# Patient Record
Sex: Female | Born: 1990 | Race: Black or African American | Hispanic: No | Marital: Single | Smoking: Never smoker
Health system: Southern US, Community
[De-identification: ages and names within clinical notes are randomized; demographics above are authoritative.]

## PROBLEM LIST (undated history)

## (undated) DIAGNOSIS — S83209A Unspecified tear of unspecified meniscus, current injury, unspecified knee, initial encounter: Secondary | ICD-10-CM

## (undated) DIAGNOSIS — L309 Dermatitis, unspecified: Secondary | ICD-10-CM

## (undated) DIAGNOSIS — F419 Anxiety disorder, unspecified: Secondary | ICD-10-CM

---
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
## 2011-02-27 ENCOUNTER — Inpatient Hospital Stay (INDEPENDENT_AMBULATORY_CARE_PROVIDER_SITE_OTHER)
Admission: RE | Admit: 2011-02-27 | Discharge: 2011-02-27 | Disposition: A | Discharge status: Home / Self Care | Payer: Medicaid Other | Source: Ambulatory Visit | Attending: Emergency Medicine | Admitting: Emergency Medicine

## 2011-02-27 ENCOUNTER — Ambulatory Visit (INDEPENDENT_AMBULATORY_CARE_PROVIDER_SITE_OTHER): Payer: Medicaid Other

## 2011-02-27 DIAGNOSIS — M2390 Unspecified internal derangement of unspecified knee: Secondary | ICD-10-CM

## 2011-04-16 ENCOUNTER — Other Ambulatory Visit: Payer: Self-pay | Admitting: Otolaryngology

## 2011-04-30 ENCOUNTER — Encounter (HOSPITAL_BASED_OUTPATIENT_CLINIC_OR_DEPARTMENT_OTHER): Payer: Self-pay | Admitting: *Deleted

## 2011-04-30 NOTE — Progress Notes (Signed)
NPO AFTER MN WITH EXCEPTION WATER/ GATORADE UNTIL 0800. PT ARRIVES AT 1215. NEEDS HG AND URINE PREG.

## 2011-05-01 ENCOUNTER — Ambulatory Visit (HOSPITAL_BASED_OUTPATIENT_CLINIC_OR_DEPARTMENT_OTHER)
Admission: RE | Admit: 2011-05-01 | Discharge: 2011-05-01 | Disposition: A | Discharge status: Home / Self Care | Payer: Self-pay | Source: Ambulatory Visit | Attending: Specialist | Admitting: Specialist

## 2011-05-01 ENCOUNTER — Encounter (HOSPITAL_BASED_OUTPATIENT_CLINIC_OR_DEPARTMENT_OTHER)
Admission: RE | Disposition: A | Discharge status: Home / Self Care | Payer: Self-pay | Source: Ambulatory Visit | Attending: Specialist

## 2011-05-01 ENCOUNTER — Encounter (HOSPITAL_BASED_OUTPATIENT_CLINIC_OR_DEPARTMENT_OTHER): Payer: Self-pay | Admitting: Certified Registered"

## 2011-05-01 ENCOUNTER — Ambulatory Visit (HOSPITAL_BASED_OUTPATIENT_CLINIC_OR_DEPARTMENT_OTHER): Payer: Self-pay | Admitting: Certified Registered"

## 2011-05-01 DIAGNOSIS — X58XXXA Exposure to other specified factors, initial encounter: Secondary | ICD-10-CM | POA: Insufficient documentation

## 2011-05-01 DIAGNOSIS — L259 Unspecified contact dermatitis, unspecified cause: Secondary | ICD-10-CM | POA: Insufficient documentation

## 2011-05-01 DIAGNOSIS — S83289A Other tear of lateral meniscus, current injury, unspecified knee, initial encounter: Secondary | ICD-10-CM | POA: Insufficient documentation

## 2011-05-01 DIAGNOSIS — M2340 Loose body in knee, unspecified knee: Secondary | ICD-10-CM | POA: Insufficient documentation

## 2011-05-01 DIAGNOSIS — M2240 Chondromalacia patellae, unspecified knee: Secondary | ICD-10-CM | POA: Insufficient documentation

## 2011-05-01 HISTORY — DX: Dermatitis, unspecified: L30.9

## 2011-05-01 HISTORY — PX: KNEE ARTHROSCOPY: SHX127

## 2011-05-01 HISTORY — DX: Unspecified tear of unspecified meniscus, current injury, unspecified knee, initial encounter: S83.209A

## 2011-05-01 SURGERY — ARTHROSCOPY, KNEE
Anesthesia: General | Site: Knee | Laterality: Left

## 2011-05-01 MED ORDER — EPINEPHRINE HCL 1 MG/ML IJ SOLN
INTRAMUSCULAR | Status: DC | PRN
  Administered 2011-05-01: 2 mL

## 2011-05-01 MED ORDER — PROMETHAZINE HCL 25 MG/ML IJ SOLN: 6.2500 mg | INTRAMUSCULAR | Status: DC | PRN

## 2011-05-01 MED ORDER — PROPOFOL 10 MG/ML IV EMUL
INTRAVENOUS | Status: DC | PRN
  Administered 2011-05-01: 160 mg via INTRAVENOUS

## 2011-05-01 MED ORDER — LACTATED RINGERS IV SOLN: INTRAVENOUS | Status: DC

## 2011-05-01 MED ORDER — ASPIRIN EC 325 MG PO TBEC: 325.0000 mg | DELAYED_RELEASE_TABLET | Freq: Every day | ORAL | Status: AC

## 2011-05-01 MED ORDER — SODIUM CHLORIDE 0.9 % IR SOLN
Status: DC | PRN
  Administered 2011-05-01 (×2): 3

## 2011-05-01 MED ORDER — MIDAZOLAM HCL 5 MG/5ML IJ SOLN
INTRAMUSCULAR | Status: DC | PRN
  Administered 2011-05-01: 2 mg via INTRAVENOUS

## 2011-05-01 MED ORDER — CEFAZOLIN SODIUM 1-5 GM-% IV SOLN: 1.0000 g | INTRAVENOUS | Status: DC

## 2011-05-01 MED ORDER — CHLORHEXIDINE GLUCONATE 4 % EX LIQD: 60.0000 mL | Freq: Once | CUTANEOUS | Status: DC

## 2011-05-01 MED ORDER — HYDROCODONE-ACETAMINOPHEN 5-325 MG PO TABS: 1.0000 | ORAL_TABLET | ORAL | Status: AC | PRN

## 2011-05-01 MED ORDER — LACTATED RINGERS IV SOLN
INTRAVENOUS | Status: DC
  Administered 2011-05-01: 16:00:00 via INTRAVENOUS
  Administered 2011-05-01: 100 mL/h via INTRAVENOUS

## 2011-05-01 MED ORDER — LIDOCAINE HCL (CARDIAC) 20 MG/ML IV SOLN
INTRAVENOUS | Status: DC | PRN
  Administered 2011-05-01: 60 mg via INTRAVENOUS

## 2011-05-01 MED ORDER — FENTANYL CITRATE 0.05 MG/ML IJ SOLN
INTRAMUSCULAR | Status: DC | PRN
  Administered 2011-05-01 (×2): 25 ug via INTRAVENOUS
  Administered 2011-05-01: 50 ug via INTRAVENOUS

## 2011-05-01 MED ORDER — BUPIVACAINE-EPINEPHRINE 0.5% -1:200000 IJ SOLN
INTRAMUSCULAR | Status: DC | PRN
  Administered 2011-05-01: 30 mL

## 2011-05-01 MED ORDER — FENTANYL CITRATE 0.05 MG/ML IJ SOLN: 25.0000 ug | INTRAMUSCULAR | Status: DC | PRN

## 2011-05-01 MED ORDER — DEXAMETHASONE SODIUM PHOSPHATE 4 MG/ML IJ SOLN
INTRAMUSCULAR | Status: DC | PRN
  Administered 2011-05-01: 4 mg via INTRAVENOUS

## 2011-05-01 MED ORDER — HYDROCODONE-ACETAMINOPHEN 5-325 MG PO TABS
1.0000 | ORAL_TABLET | Freq: Four times a day (QID) | ORAL | Status: DC | PRN
  Administered 2011-05-01 (×2): 1 via ORAL

## 2011-05-01 MED ORDER — ONDANSETRON HCL 4 MG/2ML IJ SOLN
INTRAMUSCULAR | Status: DC | PRN
  Administered 2011-05-01: 4 mg via INTRAVENOUS

## 2011-05-01 SURGICAL SUPPLY — 41 items
BANDAGE ELASTIC 6 VELCRO ST LF (GAUZE/BANDAGES/DRESSINGS) ×2 IMPLANT
BLADE 4.2CUDA (BLADE) IMPLANT
BLADE CUDA SHAVER 3.5 (BLADE) ×2 IMPLANT
BLADE GREAT WHITE 4.2 (BLADE) IMPLANT
BOOTIES KNEE HIGH SLOAN (MISCELLANEOUS) ×2 IMPLANT
CANISTER SUCT LVC 12 LTR MEDI- (MISCELLANEOUS) ×2 IMPLANT
CANISTER SUCTION 1200CC (MISCELLANEOUS) IMPLANT
CANISTER SUCTION 2500CC (MISCELLANEOUS) IMPLANT
CANNULA ACUFLEX KIT 5X76 (CANNULA) IMPLANT
CLOTH BEACON ORANGE TIMEOUT ST (SAFETY) ×2 IMPLANT
CUTTER MENISCUS  4.2MM (BLADE)
CUTTER MENISCUS 4.2MM (BLADE) IMPLANT
DRAPE ARTHROSCOPY W/POUCH 114 (DRAPES) ×2 IMPLANT
DRSG PAD ABDOMINAL 8X10 ST (GAUZE/BANDAGES/DRESSINGS) ×2 IMPLANT
DURAPREP 26ML APPLICATOR (WOUND CARE) ×2 IMPLANT
ELECT REM PT RETURN 9FT ADLT (ELECTROSURGICAL)
ELECTRODE REM PT RTRN 9FT ADLT (ELECTROSURGICAL) IMPLANT
GLOVE INDICATOR 6.5 STRL GRN (GLOVE) ×4 IMPLANT
GLOVE SURG SS PI 8.0 STRL IVOR (GLOVE) ×2 IMPLANT
GOWN ISOL THUMB LOOP REG UNIV (MISCELLANEOUS) IMPLANT
GOWN PREVENTION PLUS LG XLONG (DISPOSABLE) ×2 IMPLANT
GOWN STRL REIN XL XLG (GOWN DISPOSABLE) ×2 IMPLANT
IV NS IRRIG 3000ML ARTHROMATIC (IV SOLUTION) ×8 IMPLANT
KNEE WRAP E Z 3 GEL PACK (MISCELLANEOUS) ×2 IMPLANT
MINI VAC (SURGICAL WAND) IMPLANT
NDL SAFETY ECLIPSE 18X1.5 (NEEDLE) ×2 IMPLANT
NEEDLE FILTER BLUNT 18X 1/2SAF (NEEDLE) ×1
NEEDLE FILTER BLUNT 18X1 1/2 (NEEDLE) ×1 IMPLANT
NEEDLE HYPO 18GX1.5 SHARP (NEEDLE) ×2
PACK ARTHROSCOPY DSU (CUSTOM PROCEDURE TRAY) ×2 IMPLANT
PACK BASIN DAY SURGERY FS (CUSTOM PROCEDURE TRAY) ×2 IMPLANT
PADDING CAST COTTON 6X4 STRL (CAST SUPPLIES) ×2 IMPLANT
SET ARTHROSCOPY TUBING (MISCELLANEOUS) ×1
SET ARTHROSCOPY TUBING LN (MISCELLANEOUS) ×1 IMPLANT
SPONGE GAUZE 4X4 12PLY (GAUZE/BANDAGES/DRESSINGS) ×2 IMPLANT
SUT ETHILON 4 0 PS 2 18 (SUTURE) ×2 IMPLANT
SYR 30ML LL (SYRINGE) ×2 IMPLANT
SYRINGE 10CC LL (SYRINGE) ×2 IMPLANT
TOWEL OR 17X24 6PK STRL BLUE (TOWEL DISPOSABLE) ×2 IMPLANT
WAND 90 DEG TURBOVAC W/CORD (SURGICAL WAND) IMPLANT
WATER STERILE IRR 500ML POUR (IV SOLUTION) ×2 IMPLANT

## 2011-05-01 NOTE — Anesthesia Postprocedure Evaluation (Signed)
  Anesthesia Post-op Note  Patient: Melody Williams  Procedure(s) Performed:  ARTHROSCOPY KNEE - left knee arthroscopy with debridement ,removal of loose  bodies  Patient Location: PACU  Anesthesia Type: General  Level of Consciousness: oriented and sedated  Airway and Oxygen Therapy: Patient Spontanous Breathing  Post-op Pain: none  Post-op Assessment: Post-op Vital signs reviewed, Patient's Cardiovascular Status Stable, Respiratory Function Stable and Patent Airway  Post-op Vital Signs: stable  Complications: No apparent anesthesia complications

## 2011-05-01 NOTE — Anesthesia Postprocedure Evaluation (Signed)
  Anesthesia Post-op Note  Patient: Melody Williams  Procedure(s) Performed:  ARTHROSCOPY KNEE - left knee arthroscopy with debridement ,removal of loose  bodies  Patient Location: PACU  Anesthesia Type: General  Level of Consciousness: awake and oriented  Airway and Oxygen Therapy: Patient Spontanous Breathing  Post-op Pain: none  Post-op Assessment: Post-op Vital signs reviewed, Patient's Cardiovascular Status Stable, Respiratory Function Stable and Patent Airway  Post-op Vital Signs: stable  Complications: No apparent anesthesia complications

## 2011-05-01 NOTE — Anesthesia Preprocedure Evaluation (Addendum)
Anesthesia Evaluation  Patient identified by MRN, date of birth, ID band Patient awake    Reviewed: Allergy & Precautions, H&P , NPO status , Patient's Chart, lab work & pertinent test results, reviewed documented beta blocker date and time   Airway Mallampati: II TM Distance: >3 FB Neck ROM: full   Comment: Broken lower L premolar  Dental No notable dental hx. (+)    Pulmonary neg pulmonary ROS,  clear to auscultation  Pulmonary exam normal       Cardiovascular Exercise Tolerance: Good neg cardio ROS regular Normal    Neuro/Psych Negative Neurological ROS  Negative Psych ROS   GI/Hepatic negative GI ROS, Neg liver ROS,   Endo/Other  Negative Endocrine ROS  Renal/GU negative Renal ROS  Genitourinary negative   Musculoskeletal   Abdominal   Peds  Hematology negative hematology ROS (+)   Anesthesia Other Findings   Reproductive/Obstetrics negative OB ROS                           Anesthesia Physical Anesthesia Plan  ASA: I  Anesthesia Plan: General   Post-op Pain Management:    Induction:   Airway Management Planned:   Additional Equipment:   Intra-op Plan:   Post-operative Plan:   Informed Consent: I have reviewed the patients History and Physical, chart, labs and discussed the procedure including the risks, benefits and alternatives for the proposed anesthesia with the patient or authorized representative who has indicated his/her understanding and acceptance.   Dental Advisory Given  Plan Discussed with: CRNA  Anesthesia Plan Comments:         Anesthesia Quick Evaluation

## 2011-05-01 NOTE — Brief Op Note (Signed)
05/01/2011  4:24 PM  PATIENT:  Melody Williams  20 y.o. female  PRE-OPERATIVE DIAGNOSIS:  left meniscal tear  POST-OPERATIVE DIAGNOSIS:  left meniscal tear  PROCEDURE:  Procedure(s): ARTHROSCOPY KNEE  SURGEON:  Surgeon(s): Javier Docker  PHYSICIAN ASSISTANT:   ASSISTANTS: none   ANESTHESIA:   general  EBL:  Total I/O In: 200 [I.V.:200] Out: -   BLOOD ADMINISTERED:none  DRAINS: none   LOCAL MEDICATIONS USED:  MARCAINE 20CC  SPECIMEN:  No Specimen  DISPOSITION OF SPECIMEN:  N/A  COUNTS:  YES  TOURNIQUET:  * No tourniquets in log *  DICTATION: .Other Dictation: Dictation Number [id number]  PLAN OF CARE: Discharge to home after PACU  PATIENT DISPOSITION:  PACU - hemodynamically stable.   Delay start of Pharmacological VTE agent (>24hrs) due to surgical blood loss or risk of bleeding:  {YES/NO/NOT APPLICABLE:20182

## 2011-05-01 NOTE — Transfer of Care (Signed)
Immediate Anesthesia Transfer of Care Note  Patient: Melody Williams  Procedure(s) Performed:  ARTHROSCOPY KNEE - left knee arthroscopy with debridement ,removal of loose  bodies  Patient Location: PACU  Anesthesia Type: General  Level of Consciousness: awake, oriented, sedated and patient cooperative  Airway & Oxygen Therapy: Patient Spontanous Breathing and Patient connected to face mask oxygen  Post-op Assessment: Report given to PACU RN and Post -op Vital signs reviewed and stable  Post vital signs: Reviewed and stable  Complications: No apparent anesthesia complications

## 2011-05-01 NOTE — H&P (Signed)
Melody Williams is an 20 y.o. female.   Chief Complaint: left knee pain HPI: 20yo meniscus tear left knee  Past Medical History  Diagnosis Date  . Eczema   . Acute meniscal tear of knee LEFT    History reviewed. No pertinent past surgical history.  History reviewed. No pertinent family history. Social History:  reports that she has never smoked. She has never used smokeless tobacco. She reports that she does not drink alcohol or use illicit drugs.  Allergies:  Allergies  Allergen Reactions  . Citrus Rash  . Peanut-Containing Drug Products Itching    MOUTH W/ SEVER ITCHING    Medications Prior to Admission  Medication Dose Route Frequency Provider Last Rate Last Dose  . ceFAZolin (ANCEF) IVPB 1 g/50 mL premix  1 g Intravenous 60 min Pre-Op Liam Graham, PA      . chlorhexidine (HIBICLENS) 4 % liquid 4 application  60 mL Topical Once Liam Graham, PA      . lactated ringers infusion   Intravenous Continuous Phillips Grout, MD 100 mL/hr at 05/01/11 1320 100 mL/hr at 05/01/11 1320  . DISCONTD: lactated ringers infusion   Intravenous Continuous Liam Graham, PA       Medications Prior to Admission  Medication Sig Dispense Refill  . ibuprofen (ADVIL,MOTRIN) 800 MG tablet Take 800 mg by mouth every 8 (eight) hours as needed.          Results for orders placed during the hospital encounter of 05/01/11 (from the past 48 hour(s))  POCT HEMOGLOBIN-HEMACUE     Status: Normal   Collection Time   05/01/11  1:23 PM      Component Value Range Comment   Hemoglobin 12.4  12.0 - 15.0 (g/dL)    No results found.  Review of Systems  Constitutional: Negative.   Eyes: Negative.   Respiratory: Negative.   Cardiovascular: Negative.   Gastrointestinal: Negative.   Genitourinary: Negative.   Musculoskeletal: Positive for joint pain.  Skin: Negative.   Neurological: Negative.   Endo/Heme/Allergies: Negative.   Psychiatric/Behavioral: Negative.     Blood pressure  114/82, pulse 57, temperature 97 F (36.1 C), temperature source Oral, resp. rate 18, height 5\' 3"  (1.6 m), weight 56.246 kg (124 lb), last menstrual period 04/18/2011, SpO2 100.00%. Physical Exam  Constitutional: She appears well-developed.  HENT:  Head: Normocephalic.  Eyes: Pupils are equal, round, and reactive to light.  Neck: Normal range of motion.  Cardiovascular: Normal rate.   Respiratory: Effort normal.  GI: Soft.  Musculoskeletal: She exhibits edema and tenderness.       Left knee mcmurray, effusion, MJLT     Assessment/Plan Left knee meniscus tear. Plan Left knee arthroscopy.  Corina Stacy C 05/01/2011, 2:55 PM

## 2011-05-01 NOTE — Anesthesia Procedure Notes (Signed)
Procedure Name: LMA Insertion Date/Time: 05/01/2011 3:28 PM Performed by: Renella Cunas D Pre-anesthesia Checklist: Patient identified, Emergency Drugs available, Suction available and Patient being monitored Patient Re-evaluated:Patient Re-evaluated prior to inductionOxygen Delivery Method: Circle System Utilized Preoxygenation: Pre-oxygenation with 100% oxygen Intubation Type: IV induction Ventilation: Mask ventilation without difficulty LMA: LMA inserted LMA Size: 4.0 Number of attempts: 1 Placement Confirmation: positive ETCO2 Tube secured with: Tape Dental Injury: Teeth and Oropharynx as per pre-operative assessment

## 2011-05-02 NOTE — Op Note (Signed)
Melody Williams, Melody Williams              ACCOUNT NO.:  [id number]  MEDICAL RECORD NO.:  [id number]  LOCATION:                               FACILITY:  [facility]  PHYSICIAN:  Jene Every, M.D.    DATE OF BIRTH:  07/13/1990  DATE OF PROCEDURE:  05/01/2011 DATE OF DISCHARGE:                              OPERATIVE REPORT   PREOPERATIVE DIAGNOSIS:  Lateral meniscus tear of the left knee.  POSTOPERATIVE DIAGNOSES: 1. Lateral meniscus tear of the left knee. 2. Loose bodies of the left knee x2. 3. 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  PROCEDURES PERFORMED: 1. Left knee arthroscopy. 2. Partial lateral meniscectomy. 3. Removal of 2 loose bodies, 1 measuring 2 x1 cm, the other 1 x 1 cm.  ANESTHESIA:  General.  ASSISTANT:  None.  BRIEF HISTORY:  This is a pleasant 20 year old with locking, popping, giving way.  MRI indicating meniscus tear of lateral compartment, which was indicated for partial meniscectomy to a stable base.  Risks and benefits were discussed including bleeding, infection, no change in symptoms, worsening symptoms, need for repeat debridement, DVT, PE, anesthetic complications, etc.  TECHNIQUE:  With the patient in supine position after induction of adequate anesthesia and 1 g of Kefzol, left lower extremity was prepped and draped in usual sterile fashion.  Exam under anesthesia revealed recurvatum that she had and it is bilaterally.  A lateral parapatellar portal was fashioned with #11 blade.  Ingress cannula was atraumatically placed.  Irrigant was utilized to insufflate the joint, 65 mmHg was used throughout the case.  Under direct visualization, medial parapatellar portal was fashioned with the #11 blade after localization with an 18- gauge needle sparing the medial meniscus.  Noted on the medial compartment, normal femoral condyle, tibial plateau, and meniscus stable to probe palpation without evidence of tearing.  Lateral compartment revealed a loose  body into the lateral compartment, a large grade 3 lesion of the femoral condyle, and tearing along the entire posterior half of the meniscus with some radial tearing.  A basket rongeur was introduced and utilized to perform partial lateral meniscectomy to a stable base, further contoured with a 3.5 Cuda shaver. Remnant stable to probe palpation.  Loose body was difficulty to retrieve as it was within the lateral joint space and then in the posterior capsule.  I __________ forward with a nerve hook, grasper, with a pituitary; and after multiple attempts, I was able to secure that and brought through the medial parapatellar portal which had to be enlarged to remove the loose body.  The second loose body to those was removed in the similar fashion.  This was cartilaginous in the donor site, with defect in the femoral condyle.  Defect was extensively probed.  The remainder of it was stable.  The suprapatellar pouch revealed some moderate degenerative changes of the patella.  Light chondroplasty was performed here, and no patellofemoral tracking, gutters were unremarkable.  On revisit to all compartments, no further pathology, amenable to arthroscopic intervention.  I, therefore, removed all instrumentation.  Portals were closed with 4-0 nylon simple sutures, 0.25% Marcaine with epinephrine was infiltrated in the joint.  The wound was dressed sterilely.  The  patient was awoken without difficulty and transported to the recovery room in satisfactory condition.  The patient tolerated the procedure well.  No complications.  No assistant.  Minimal blood loss.     Jene Every, M.D.     Cordelia Pen  D:  05/01/2011  T:  05/01/2011  Job:  [id number]

## 2011-05-04 LAB — POCT PREGNANCY, URINE: Preg Test, Ur: NEGATIVE

## 2011-05-07 ENCOUNTER — Encounter (HOSPITAL_BASED_OUTPATIENT_CLINIC_OR_DEPARTMENT_OTHER): Payer: Self-pay | Admitting: Specialist

## 2012-02-23 ENCOUNTER — Other Ambulatory Visit: Payer: Self-pay

## 2012-02-23 LAB — OB RESULTS CONSOLE ABO/RH

## 2012-02-23 LAB — OB RESULTS CONSOLE ANTIBODY SCREEN: Antibody Screen: NEGATIVE

## 2012-03-02 ENCOUNTER — Other Ambulatory Visit (HOSPITAL_COMMUNITY): Payer: Self-pay | Admitting: Obstetrics and Gynecology

## 2012-03-02 DIAGNOSIS — O30009 Twin pregnancy, unspecified number of placenta and unspecified number of amniotic sacs, unspecified trimester: Secondary | ICD-10-CM

## 2012-04-05 ENCOUNTER — Ambulatory Visit (HOSPITAL_COMMUNITY): Payer: Medicaid Other | Attending: Obstetrics and Gynecology

## 2012-04-19 ENCOUNTER — Ambulatory Visit (HOSPITAL_COMMUNITY)
Admission: RE | Admit: 2012-04-19 | Discharge: 2012-04-19 | Disposition: A | Discharge status: Home / Self Care | Payer: Medicaid Other | Source: Ambulatory Visit | Attending: Obstetrics and Gynecology | Admitting: Obstetrics and Gynecology

## 2012-04-19 ENCOUNTER — Encounter (HOSPITAL_COMMUNITY): Payer: Self-pay

## 2012-04-19 DIAGNOSIS — Z363 Encounter for antenatal screening for malformations: Secondary | ICD-10-CM | POA: Insufficient documentation

## 2012-04-19 DIAGNOSIS — O358XX0 Maternal care for other (suspected) fetal abnormality and damage, not applicable or unspecified: Secondary | ICD-10-CM | POA: Insufficient documentation

## 2012-04-19 DIAGNOSIS — Z1389 Encounter for screening for other disorder: Secondary | ICD-10-CM | POA: Insufficient documentation

## 2012-04-19 DIAGNOSIS — O30009 Twin pregnancy, unspecified number of placenta and unspecified number of amniotic sacs, unspecified trimester: Secondary | ICD-10-CM

## 2012-04-19 NOTE — Progress Notes (Signed)
Ms. Tessler was seen for ultrasound appointment today.  Please see [facility] report for details.

## 2012-04-25 ENCOUNTER — Telehealth (HOSPITAL_COMMUNITY): Payer: Self-pay | Admitting: *Deleted

## 2012-04-25 NOTE — Telephone Encounter (Signed)
Called pt at number that was listed with Dr. Lynelle Doctor office.  Pt was not home, but left message to call back.

## 2012-04-25 NOTE — Telephone Encounter (Signed)
Pt called back to confirm appointment.  Asked pt to go by primary office to pick up disc with images of [place] on 02/23/12 and bring with her to appt on 05/05/12.  Pt verbalized understanding.

## 2012-04-25 NOTE — Telephone Encounter (Signed)
Dr. Delford Field of [facility] called after reviewing pt's [place] report from 02/23/12.  He is unable to determine if the pt has monochorionic or dichorionic twins.  Requested that MD place images on a disc and will have the pt pick up disc and bring to next appt with MFM.  Per Dr. Vincenza Hews, pt needs to be scheduled sooner since not sure about mono or dichorionic.  Made appointment for 05/05/12.  Attempted to call pt at both her and her mother's phone numbers.  Pt's cell phone has been disconnected and mothers number has no answer.  Will contact primary office for another number.

## 2012-05-03 ENCOUNTER — Other Ambulatory Visit (HOSPITAL_COMMUNITY): Payer: Self-pay | Admitting: Obstetrics and Gynecology

## 2012-05-03 DIAGNOSIS — O30009 Twin pregnancy, unspecified number of placenta and unspecified number of amniotic sacs, unspecified trimester: Secondary | ICD-10-CM

## 2012-05-05 ENCOUNTER — Other Ambulatory Visit (HOSPITAL_COMMUNITY): Payer: Self-pay | Admitting: Obstetrics and Gynecology

## 2012-05-05 ENCOUNTER — Ambulatory Visit (HOSPITAL_COMMUNITY): Admission: RE | Admit: 2012-05-05 | Payer: Medicaid Other | Source: Ambulatory Visit

## 2012-05-05 DIAGNOSIS — O30009 Twin pregnancy, unspecified number of placenta and unspecified number of amniotic sacs, unspecified trimester: Secondary | ICD-10-CM

## 2012-05-17 ENCOUNTER — Ambulatory Visit (HOSPITAL_COMMUNITY): Payer: Medicaid Other

## 2012-05-25 ENCOUNTER — Ambulatory Visit (HOSPITAL_COMMUNITY): Payer: Medicaid Other | Attending: Obstetrics and Gynecology

## 2012-06-09 ENCOUNTER — Other Ambulatory Visit (HOSPITAL_COMMUNITY): Payer: Self-pay | Admitting: Obstetrics and Gynecology

## 2012-06-09 DIAGNOSIS — O30009 Twin pregnancy, unspecified number of placenta and unspecified number of amniotic sacs, unspecified trimester: Secondary | ICD-10-CM

## 2012-06-10 ENCOUNTER — Ambulatory Visit (HOSPITAL_COMMUNITY)
Admission: RE | Admit: 2012-06-10 | Discharge: 2012-06-10 | Disposition: A | Discharge status: Home / Self Care | Payer: Medicaid Other | Source: Ambulatory Visit | Attending: Obstetrics and Gynecology | Admitting: Obstetrics and Gynecology

## 2012-06-10 DIAGNOSIS — Z3689 Encounter for other specified antenatal screening: Secondary | ICD-10-CM | POA: Insufficient documentation

## 2012-06-10 DIAGNOSIS — O30009 Twin pregnancy, unspecified number of placenta and unspecified number of amniotic sacs, unspecified trimester: Secondary | ICD-10-CM | POA: Insufficient documentation

## 2012-06-10 NOTE — ED Notes (Signed)
Pt forgot to pick up CD with original [place] images.

## 2012-06-17 ENCOUNTER — Other Ambulatory Visit (HOSPITAL_COMMUNITY): Payer: Self-pay | Admitting: Obstetrics and Gynecology

## 2012-06-17 DIAGNOSIS — IMO0001 Reserved for inherently not codable concepts without codable children: Secondary | ICD-10-CM

## 2012-06-24 ENCOUNTER — Ambulatory Visit (HOSPITAL_COMMUNITY): Admission: RE | Admit: 2012-06-24 | Payer: Medicaid Other | Source: Ambulatory Visit

## 2012-06-29 ENCOUNTER — Other Ambulatory Visit (HOSPITAL_COMMUNITY): Payer: Self-pay | Admitting: Obstetrics and Gynecology

## 2012-06-29 DIAGNOSIS — O30009 Twin pregnancy, unspecified number of placenta and unspecified number of amniotic sacs, unspecified trimester: Secondary | ICD-10-CM

## 2012-07-01 ENCOUNTER — Other Ambulatory Visit (HOSPITAL_COMMUNITY): Payer: Self-pay | Admitting: Obstetrics and Gynecology

## 2012-07-01 ENCOUNTER — Ambulatory Visit (HOSPITAL_COMMUNITY)
Admission: RE | Admit: 2012-07-01 | Discharge: 2012-07-01 | Disposition: A | Discharge status: Home / Self Care | Payer: Medicaid Other | Source: Ambulatory Visit | Attending: Obstetrics and Gynecology | Admitting: Obstetrics and Gynecology

## 2012-07-01 ENCOUNTER — Ambulatory Visit (HOSPITAL_COMMUNITY)
Admission: RE | Admit: 2012-07-01 | Discharge: 2012-07-01 | Disposition: A | Discharge status: Home / Self Care | Payer: Medicaid Other | Source: Ambulatory Visit

## 2012-07-01 VITALS — BP 112/68 | HR 90 | Wt 153.5 lb

## 2012-07-01 DIAGNOSIS — O30009 Twin pregnancy, unspecified number of placenta and unspecified number of amniotic sacs, unspecified trimester: Secondary | ICD-10-CM

## 2012-07-01 DIAGNOSIS — Z3689 Encounter for other specified antenatal screening: Secondary | ICD-10-CM | POA: Insufficient documentation

## 2012-07-01 DIAGNOSIS — IMO0002 Reserved for concepts with insufficient information to code with codable children: Secondary | ICD-10-CM

## 2012-07-01 MED ORDER — BETAMETHASONE SOD PHOS & ACET 6 (3-3) MG/ML IJ SUSP
12.0000 mg | Freq: Once | INTRAMUSCULAR | Status: AC
  Administered 2012-07-01: 12 mg via INTRAMUSCULAR
  Filled 2012-07-01: qty 2

## 2012-07-01 NOTE — ED Notes (Signed)
BMZ given.  See MAR for documentation. Pt to return to [facility] tomorrow for 2nd dose.

## 2012-07-01 NOTE — Progress Notes (Signed)
[facility] ultrasound  Indication: 22 yr old G1P0 at [redacted]w[redacted]d by a first trimester ultrasound in her primary OB office with twin gestation (suspected dichorionic) with lagging growth for follow up ultrasound.  Findings: 1. Twin pregnancy; the dividing membrane is seen. 2. Both placentas are posterior without evidence of previa. 3. Normal amniotic fluid volume for both fetuses; although slightly decreased for gestational age in twin A. 4. Estimated fetal weight for twin A is in the <10th%; twin B is in the 14th%. The fetuses are concordant. 5. The limited anatomy survey for both fetuses is normal. Normal stomach and bladder seen in each fetus. 6. Normal biophysical profile of 8/8 for each fetus. 7. Normal umbilical artery Doppler studies for both fetuses.  Recommendations: 1. Twin gestation: - suspected dichorionic but we do not have early picutres to be able to evaluate the membrane therefore will err on the side of caution and manage more conservatively as monochorionic/diamniotic and obtain ultrasounds at least every 2 weeks and recommend delivery by 37 weeks - preterm labor precautions - no evidence of twin twin transfusion syndrome seen on today's ultrasound 2. Twin A with fetal growth restriction and lagging fetal growth for twin B: - previously counseled - I discussed now that there is fetal growth restriction in twin A there is an increased risk of stillbirth, of need for iatrogenic preterm delivery, and possible need for hospitalization -  given fetal growth restriction recommend a course of betamethasone- first dose was given today; tomorrow patient will receive second dose on labor and delivery at [facility] in [place]- discussed benefit in reducing morbidity and mortality in premature infants  - recommend fetal growth in 3 weeks  - - recommend twice weekly nonstress tests and weekly AFI and umbilical artery Doppler studies - recommend Neonatology consult  -  recommend delivery for nonreassuring fetal status or no fetal growth  - discussed may require hospitalization if Doppler studies become abnormal  Discussed with Dr. Anitra Lauth, MD

## 2012-07-01 NOTE — Consult Note (Signed)
MFM consult  22 yr old G1P0 at [redacted]w[redacted]d by a first trimester ultrasound in her primary OB office with twin gestation (suspected dichorionic) with lagging growth referred by Dr. Delford Field for follow up ultrasound and consult.  Ultrasound today shows: twin pregnancy; the dividing membrane is seen. Both placentas are posterior without evidence of previa. Normal amniotic fluid volume for both fetuses; although slightly decreased for gestational age in twin A. Estimated fetal weight for twin A is in the <10th%; twin B is in the 14th%. The fetuses are concordant. The limited anatomy survey for both fetuses is normal. Normal stomach and bladder seen in each fetus. Normal biophysical profile of 8/8 for each fetus. Normal umbilical artery Doppler studies for both fetuses.  I counseled the patient as follows:  1. Twin gestation: - suspected dichorionic but we do not have early picutres to be able to evaluate the membrane therefore will err on the side of caution and manage more conservatively as monochorionic/diamniotic and obtain ultrasounds at least every 2 weeks and recommend delivery by 37 weeks - preterm labor precautions - no evidence of twin twin transfusion syndrome seen on today's ultrasound 2. Twin A with fetal growth restriction and lagging fetal growth for twin B: - previously counseled - I discussed now that there is fetal growth restriction in twin A there is an increased risk of stillbirth, of need for iatrogenic preterm delivery, and possible need for hospitalization -  given fetal growth restriction recommend a course of betamethasone- first dose was given today; tomorrow patient will receive second dose on labor and delivery at [facility] in [place]- discussed benefit in reducing morbidity and mortality in premature infants  - recommend fetal growth in 3 weeks  - - recommend twice weekly nonstress tests and weekly AFI and umbilical artery Doppler studies - recommend Neonatology consult   - recommend delivery for nonreassuring fetal status or no fetal growth  - discussed may require hospitalization if Doppler studies become abnormal - recommend delivery at 37 weeks or sooner for the above indications  Discussed with Dr. Delford Field  I spent 20 minutes in face to face consultation with the patient in addition to time spent on the ultrasound.  Eulis Foster, MD

## 2012-07-07 ENCOUNTER — Other Ambulatory Visit (HOSPITAL_COMMUNITY): Payer: Self-pay | Admitting: Obstetrics and Gynecology

## 2012-07-07 DIAGNOSIS — IMO0002 Reserved for concepts with insufficient information to code with codable children: Secondary | ICD-10-CM

## 2012-07-07 DIAGNOSIS — O30009 Twin pregnancy, unspecified number of placenta and unspecified number of amniotic sacs, unspecified trimester: Secondary | ICD-10-CM

## 2012-07-08 ENCOUNTER — Encounter (HOSPITAL_COMMUNITY): Payer: Self-pay

## 2012-07-08 ENCOUNTER — Ambulatory Visit (HOSPITAL_COMMUNITY)
Admission: RE | Admit: 2012-07-08 | Discharge: 2012-07-08 | Disposition: A | Discharge status: Home / Self Care | Payer: Medicaid Other | Source: Ambulatory Visit | Attending: Obstetrics and Gynecology | Admitting: Obstetrics and Gynecology

## 2012-07-08 DIAGNOSIS — IMO0002 Reserved for concepts with insufficient information to code with codable children: Secondary | ICD-10-CM

## 2012-07-08 DIAGNOSIS — O365990 Maternal care for other known or suspected poor fetal growth, unspecified trimester, not applicable or unspecified: Secondary | ICD-10-CM | POA: Insufficient documentation

## 2012-07-08 DIAGNOSIS — O30009 Twin pregnancy, unspecified number of placenta and unspecified number of amniotic sacs, unspecified trimester: Secondary | ICD-10-CM

## 2012-07-08 NOTE — Progress Notes (Signed)
Melody Williams  was seen today for an ultrasound appointment.  See full report in [facility].  Comments: [name] returns for follow up due to lagging growth of both twins (Twin A < 10th %tile by ultrasound; Twin B at the 14th %tile last week).  Twins are felt to be dichorionic by earlier ultrasounds.  She completed her first dose of betamethasone last Friday and was given a second dose on Tuesday.   Impression: Suspected DC/DA twin gestation  Twin A: BPP 8/8 Normal umbilical artery Doppler studies for gestational age Subjectively decreased amniotic fluid volume   Twin B: BPP 8/8 Normal umbilical artery Doppler studies for gestational age Normal amniotic fluid volume  Recommendations: Continue weekly BPPs/ UA Doppler studies. Follow up growth scan in 2 weeks. Recommend delivery for nonreassuring fetal status or no fetal growth - in patient observation if Doppler studies become abnormal.  Alpha Gula, MD

## 2012-07-14 ENCOUNTER — Other Ambulatory Visit (HOSPITAL_COMMUNITY): Payer: Self-pay | Admitting: Obstetrics and Gynecology

## 2012-07-14 DIAGNOSIS — IMO0002 Reserved for concepts with insufficient information to code with codable children: Secondary | ICD-10-CM

## 2012-07-14 DIAGNOSIS — O30009 Twin pregnancy, unspecified number of placenta and unspecified number of amniotic sacs, unspecified trimester: Secondary | ICD-10-CM

## 2012-07-15 ENCOUNTER — Ambulatory Visit (HOSPITAL_COMMUNITY): Payer: Medicaid Other | Attending: Obstetrics and Gynecology

## 2012-07-22 ENCOUNTER — Ambulatory Visit (HOSPITAL_COMMUNITY): Admission: RE | Admit: 2012-07-22 | Payer: Medicaid Other | Source: Ambulatory Visit

## 2012-07-27 ENCOUNTER — Ambulatory Visit (HOSPITAL_COMMUNITY)
Admission: RE | Admit: 2012-07-27 | Discharge: 2012-07-27 | Disposition: A | Discharge status: Home / Self Care | Payer: Medicaid Other | Source: Ambulatory Visit | Attending: Obstetrics and Gynecology | Admitting: Obstetrics and Gynecology

## 2012-07-27 ENCOUNTER — Other Ambulatory Visit (HOSPITAL_COMMUNITY): Payer: Self-pay | Admitting: Obstetrics and Gynecology

## 2012-07-27 VITALS — BP 120/62 | HR 64 | Wt 164.8 lb

## 2012-07-27 DIAGNOSIS — IMO0002 Reserved for concepts with insufficient information to code with codable children: Secondary | ICD-10-CM

## 2012-07-27 DIAGNOSIS — O30009 Twin pregnancy, unspecified number of placenta and unspecified number of amniotic sacs, unspecified trimester: Secondary | ICD-10-CM | POA: Insufficient documentation

## 2012-07-27 DIAGNOSIS — O365990 Maternal care for other known or suspected poor fetal growth, unspecified trimester, not applicable or unspecified: Secondary | ICD-10-CM | POA: Insufficient documentation

## 2012-07-27 NOTE — Progress Notes (Signed)
Melody Williams  was seen today for an ultrasound appointment.  See full report in [facility].  Impression: DC/DA twin gestataion with best dates of 34 2/7 weeks  Twin A: Maternal right, cephalic, fundal placenta Fetal growth restriction noted with estimated fetal weight at the 10th %tile.  [facility] measures at the 7th %tile The fetus is active with a BPP of 8/8 The amniotic fluid is subjectively low- normal (max verticle pocket of 4 cm) UA Dopplers elevated (90th %tile for gestational age) but without absent or reversed diastolic flow  Twin B: Maternal left, transverse/oblique lie, anterior placenta The estimated fetal weight is at the 18th %tile Active fetus with BPP of 8/8 Normal umbilical artery Doppler studies without absent or reversed diastolic flow Normal amniotic fluid volume  Recommendations: Recommend weekly BPPs with UA Doppler studies. Would move toward delivery for BPP < 6/8 or absent/ reversed diastolic flow on UA Dopplers If testing is otherwise stable, recommend delivery at [redacted] weeks gestation due to IUGR of twin A.  Alpha Gula, MD

## 2012-07-29 ENCOUNTER — Ambulatory Visit (HOSPITAL_COMMUNITY): Payer: Medicaid Other

## 2012-08-03 ENCOUNTER — Other Ambulatory Visit (HOSPITAL_COMMUNITY): Payer: Self-pay | Admitting: Maternal and Fetal Medicine

## 2012-08-03 DIAGNOSIS — O30009 Twin pregnancy, unspecified number of placenta and unspecified number of amniotic sacs, unspecified trimester: Secondary | ICD-10-CM

## 2012-08-03 DIAGNOSIS — IMO0002 Reserved for concepts with insufficient information to code with codable children: Secondary | ICD-10-CM

## 2012-08-04 ENCOUNTER — Ambulatory Visit (HOSPITAL_COMMUNITY)
Admission: RE | Admit: 2012-08-04 | Discharge: 2012-08-04 | Disposition: A | Discharge status: Home / Self Care | Payer: Medicaid Other | Source: Ambulatory Visit | Attending: Obstetrics and Gynecology | Admitting: Obstetrics and Gynecology

## 2012-08-04 ENCOUNTER — Encounter (HOSPITAL_COMMUNITY): Payer: Self-pay | Admitting: *Deleted

## 2012-08-04 DIAGNOSIS — O30009 Twin pregnancy, unspecified number of placenta and unspecified number of amniotic sacs, unspecified trimester: Secondary | ICD-10-CM

## 2012-08-04 DIAGNOSIS — IMO0002 Reserved for concepts with insufficient information to code with codable children: Secondary | ICD-10-CM

## 2012-08-04 NOTE — Progress Notes (Signed)
Received phone call from Dr. Claudean Severance to accept care of this patient. This is a 22 yo G1P0 at [redacted]w[redacted]d with Di-Di twins. She has been followed by MFM for poor growth in twin A. Twin A was found to be IUGR with EFW <10% tile and oligohydramnios today. Twin B has EFW at 18%tile with normal fluid. BPP for twin A 8/10 and twin B 10/10. In view of the oligohydramnios, MFM recommended delivery within the next 24-48 hours. Patient was planning on having a cesarean section but Ob from [facility] cannot deliver less than 36 weeks in their facility. Patient had a full mean of [employer] at 3:30pm. Dr. Claudean Severance does not feel that patient needs to remain overnight. She is scheduled for primary cesarean section at 14:30 on 3/28. Patient was advised to present at 12:30 pm. All questions were answered.

## 2012-08-04 NOTE — Progress Notes (Signed)
Melody Williams  was seen today for an ultrasound appointment.  See full report in [facility].  Impression: DC/DA twin gestataion with best dates of 32 3/7 weeks  Twin A: Maternal right, cephalic, fundal placenta Suspected fetal growth restriction (EFW < 10th %tile on 3/19) The fetus is active with a BPP of 8/10 (-2 for oligohydramnios) Oligohydramnios UA Dopplers elevated (90th %tile for gestational age) but without absent or reversed diastolic flow  Twin B: Transverse, fetal head to the maternal right Lagging AC on 3/19 (EFW at 18th %tile, AC at 7th %tile) BPP 8/10 (-2 for absent breathing movement) Elevated UA Dopplers, but no evidence of absent or reversed diastolic flow Normal amniotic fluid volume  Recommendations: Recommend delivery due to new onset oligohydramnios in Twin A. Patient is tentatively scheduled for C-section tomorrow AM with the faculty practice.  Alpha Gula, MD

## 2012-08-05 ENCOUNTER — Inpatient Hospital Stay (HOSPITAL_COMMUNITY)
Admission: AD | Admit: 2012-08-05 | Discharge: 2012-08-08 | DRG: 765 | Disposition: A | Discharge status: Home / Self Care | Payer: Medicaid Other | Source: Ambulatory Visit | Attending: Obstetrics & Gynecology | Admitting: Obstetrics & Gynecology

## 2012-08-05 ENCOUNTER — Encounter (HOSPITAL_COMMUNITY): Payer: Self-pay | Admitting: *Deleted

## 2012-08-05 ENCOUNTER — Inpatient Hospital Stay (HOSPITAL_COMMUNITY): Payer: Medicaid Other | Admitting: Anesthesiology

## 2012-08-05 ENCOUNTER — Encounter (HOSPITAL_COMMUNITY): Payer: Self-pay | Admitting: Anesthesiology

## 2012-08-05 ENCOUNTER — Encounter (HOSPITAL_COMMUNITY)
Admission: AD | Disposition: A | Discharge status: Home / Self Care | Payer: Self-pay | Source: Ambulatory Visit | Attending: Obstetrics & Gynecology

## 2012-08-05 DIAGNOSIS — D649 Anemia, unspecified: Secondary | ICD-10-CM | POA: Diagnosis not present

## 2012-08-05 DIAGNOSIS — O4100X0 Oligohydramnios, unspecified trimester, not applicable or unspecified: Secondary | ICD-10-CM

## 2012-08-05 DIAGNOSIS — O30009 Twin pregnancy, unspecified number of placenta and unspecified number of amniotic sacs, unspecified trimester: Principal | ICD-10-CM | POA: Diagnosis present

## 2012-08-05 DIAGNOSIS — O365990 Maternal care for other known or suspected poor fetal growth, unspecified trimester, not applicable or unspecified: Secondary | ICD-10-CM

## 2012-08-05 DIAGNOSIS — O9903 Anemia complicating the puerperium: Secondary | ICD-10-CM | POA: Diagnosis not present

## 2012-08-05 DIAGNOSIS — IMO0002 Reserved for concepts with insufficient information to code with codable children: Secondary | ICD-10-CM | POA: Diagnosis present

## 2012-08-05 DIAGNOSIS — O30049 Twin pregnancy, dichorionic/diamniotic, unspecified trimester: Secondary | ICD-10-CM

## 2012-08-05 LAB — TYPE AND SCREEN
ABO/RH(D): O POS
Antibody Screen: NEGATIVE

## 2012-08-05 LAB — ABO/RH: ABO/RH(D): O POS

## 2012-08-05 LAB — CBC
Hemoglobin: 11.2 g/dL — ABNORMAL LOW (ref 12.0–15.0)
RBC: 3.76 MIL/uL — ABNORMAL LOW (ref 3.87–5.11)
WBC: 10 10*3/uL (ref 4.0–10.5)

## 2012-08-05 LAB — RPR: RPR Ser Ql: NONREACTIVE

## 2012-08-05 SURGERY — Surgical Case
Anesthesia: Spinal | Wound class: Clean Contaminated

## 2012-08-05 MED ORDER — SIMETHICONE 80 MG PO CHEW: 80.0000 mg | CHEWABLE_TABLET | ORAL | Status: DC | PRN

## 2012-08-05 MED ORDER — KETOROLAC TROMETHAMINE 60 MG/2ML IM SOLN: 60.0000 mg | Freq: Once | INTRAMUSCULAR | Status: AC | PRN

## 2012-08-05 MED ORDER — CEFAZOLIN SODIUM-DEXTROSE 2-3 GM-% IV SOLR
INTRAVENOUS | Status: AC
  Filled 2012-08-05: qty 50

## 2012-08-05 MED ORDER — MENTHOL 3 MG MT LOZG: 1.0000 | LOZENGE | OROMUCOSAL | Status: DC | PRN

## 2012-08-05 MED ORDER — LACTATED RINGERS IV SOLN: INTRAVENOUS | Status: DC

## 2012-08-05 MED ORDER — SCOPOLAMINE 1 MG/3DAYS TD PT72: 1.0000 | MEDICATED_PATCH | Freq: Once | TRANSDERMAL | Status: DC

## 2012-08-05 MED ORDER — CEFAZOLIN SODIUM-DEXTROSE 2-3 GM-% IV SOLR
2.0000 g | INTRAVENOUS | Status: AC
  Administered 2012-08-05: 2 g via INTRAVENOUS

## 2012-08-05 MED ORDER — METOCLOPRAMIDE HCL 5 MG/ML IJ SOLN
INTRAMUSCULAR | Status: AC
  Administered 2012-08-05: 10 mg via INTRAVENOUS
  Filled 2012-08-05: qty 2

## 2012-08-05 MED ORDER — OXYTOCIN 10 UNIT/ML IJ SOLN
INTRAMUSCULAR | Status: AC
  Filled 2012-08-05: qty 1

## 2012-08-05 MED ORDER — ONDANSETRON HCL 4 MG/2ML IJ SOLN: 4.0000 mg | Freq: Three times a day (TID) | INTRAMUSCULAR | Status: DC | PRN

## 2012-08-05 MED ORDER — IBUPROFEN 600 MG PO TABS: 600.0000 mg | ORAL_TABLET | Freq: Four times a day (QID) | ORAL | Status: DC | PRN

## 2012-08-05 MED ORDER — SIMETHICONE 80 MG PO CHEW
80.0000 mg | CHEWABLE_TABLET | Freq: Three times a day (TID) | ORAL | Status: DC
  Administered 2012-08-05 – 2012-08-08 (×11): 80 mg via ORAL

## 2012-08-05 MED ORDER — KETOROLAC TROMETHAMINE 30 MG/ML IJ SOLN: 30.0000 mg | Freq: Four times a day (QID) | INTRAMUSCULAR | Status: AC | PRN

## 2012-08-05 MED ORDER — PRENATAL MULTIVITAMIN CH
1.0000 | ORAL_TABLET | Freq: Every day | ORAL | Status: DC
  Administered 2012-08-06 – 2012-08-08 (×3): 1 via ORAL
  Filled 2012-08-05 (×3): qty 1

## 2012-08-05 MED ORDER — OXYCODONE-ACETAMINOPHEN 5-325 MG PO TABS
1.0000 | ORAL_TABLET | ORAL | Status: DC | PRN
  Administered 2012-08-06: 1 via ORAL
  Administered 2012-08-06 – 2012-08-07 (×2): 2 via ORAL
  Administered 2012-08-07: 1 via ORAL
  Filled 2012-08-05 (×2): qty 1
  Filled 2012-08-05 (×2): qty 2

## 2012-08-05 MED ORDER — TETANUS-DIPHTH-ACELL PERTUSSIS 5-2.5-18.5 LF-MCG/0.5 IM SUSP
0.5000 mL | Freq: Once | INTRAMUSCULAR | Status: AC
  Administered 2012-08-07: 0.5 mL via INTRAMUSCULAR

## 2012-08-05 MED ORDER — DIPHENHYDRAMINE HCL 25 MG PO CAPS: 25.0000 mg | ORAL_CAPSULE | Freq: Four times a day (QID) | ORAL | Status: DC | PRN

## 2012-08-05 MED ORDER — SCOPOLAMINE 1 MG/3DAYS TD PT72
MEDICATED_PATCH | TRANSDERMAL | Status: AC
  Administered 2012-08-05: 1.5 mg via TRANSDERMAL
  Filled 2012-08-05: qty 1

## 2012-08-05 MED ORDER — DIPHENHYDRAMINE HCL 50 MG/ML IJ SOLN: 25.0000 mg | INTRAMUSCULAR | Status: DC | PRN

## 2012-08-05 MED ORDER — DIPHENHYDRAMINE HCL 50 MG/ML IJ SOLN: 12.5000 mg | INTRAMUSCULAR | Status: DC | PRN

## 2012-08-05 MED ORDER — DEXTROSE 5 % IV SOLN: 1.0000 ug/kg/h | INTRAVENOUS | Status: DC | PRN

## 2012-08-05 MED ORDER — NALBUPHINE HCL 10 MG/ML IJ SOLN: 5.0000 mg | INTRAMUSCULAR | Status: DC | PRN

## 2012-08-05 MED ORDER — PROMETHAZINE HCL 25 MG/ML IJ SOLN
INTRAMUSCULAR | Status: AC
  Administered 2012-08-05: 6.25 mg via INTRAVENOUS
  Filled 2012-08-05: qty 1

## 2012-08-05 MED ORDER — KETOROLAC TROMETHAMINE 60 MG/2ML IM SOLN
INTRAMUSCULAR | Status: AC
  Administered 2012-08-05: 60 mg via INTRAMUSCULAR
  Filled 2012-08-05: qty 2

## 2012-08-05 MED ORDER — METOCLOPRAMIDE HCL 5 MG/ML IJ SOLN: 10.0000 mg | Freq: Three times a day (TID) | INTRAMUSCULAR | Status: DC | PRN

## 2012-08-05 MED ORDER — DIBUCAINE 1 % RE OINT: 1.0000 "application " | TOPICAL_OINTMENT | RECTAL | Status: DC | PRN

## 2012-08-05 MED ORDER — METOCLOPRAMIDE HCL 5 MG/ML IJ SOLN: 10.0000 mg | Freq: Once | INTRAMUSCULAR | Status: AC

## 2012-08-05 MED ORDER — PROMETHAZINE HCL 25 MG/ML IJ SOLN: 6.2500 mg | Freq: Once | INTRAMUSCULAR | Status: AC

## 2012-08-05 MED ORDER — FENTANYL CITRATE 0.05 MG/ML IJ SOLN: 25.0000 ug | INTRAMUSCULAR | Status: DC | PRN

## 2012-08-05 MED ORDER — ONDANSETRON HCL 4 MG PO TABS: 4.0000 mg | ORAL_TABLET | ORAL | Status: DC | PRN

## 2012-08-05 MED ORDER — LACTATED RINGERS IV SOLN
INTRAVENOUS | Status: DC
  Administered 2012-08-05: 15:00:00 via INTRAVENOUS
  Administered 2012-08-05: 125 mL/h via INTRAVENOUS
  Administered 2012-08-05 (×2): via INTRAVENOUS

## 2012-08-05 MED ORDER — OXYTOCIN 40 UNITS IN LACTATED RINGERS INFUSION - SIMPLE MED
62.5000 mL/h | INTRAVENOUS | Status: AC
  Administered 2012-08-05: 62.5 mL/h via INTRAVENOUS
  Filled 2012-08-05: qty 1000

## 2012-08-05 MED ORDER — IBUPROFEN 600 MG PO TABS
600.0000 mg | ORAL_TABLET | Freq: Four times a day (QID) | ORAL | Status: DC
  Administered 2012-08-06 – 2012-08-08 (×11): 600 mg via ORAL
  Filled 2012-08-05 (×11): qty 1

## 2012-08-05 MED ORDER — ONDANSETRON HCL 4 MG/2ML IJ SOLN
INTRAMUSCULAR | Status: AC
  Filled 2012-08-05: qty 2

## 2012-08-05 MED ORDER — FENTANYL CITRATE 0.05 MG/ML IJ SOLN
INTRAMUSCULAR | Status: DC | PRN
  Administered 2012-08-05: 15 ug via INTRATHECAL

## 2012-08-05 MED ORDER — FENTANYL CITRATE 0.05 MG/ML IJ SOLN
INTRAMUSCULAR | Status: AC
  Filled 2012-08-05: qty 2

## 2012-08-05 MED ORDER — BUPIVACAINE IN DEXTROSE 0.75-8.25 % IT SOLN
INTRATHECAL | Status: DC | PRN
  Administered 2012-08-05: 1.4 mL via INTRATHECAL

## 2012-08-05 MED ORDER — WITCH HAZEL-GLYCERIN EX PADS: 1.0000 "application " | MEDICATED_PAD | CUTANEOUS | Status: DC | PRN

## 2012-08-05 MED ORDER — MORPHINE SULFATE (PF) 0.5 MG/ML IJ SOLN
INTRAMUSCULAR | Status: DC | PRN
  Administered 2012-08-05: .1 mg via INTRATHECAL

## 2012-08-05 MED ORDER — SODIUM CHLORIDE 0.9 % IJ SOLN: 3.0000 mL | INTRAMUSCULAR | Status: DC | PRN

## 2012-08-05 MED ORDER — SENNOSIDES-DOCUSATE SODIUM 8.6-50 MG PO TABS
2.0000 | ORAL_TABLET | Freq: Every day | ORAL | Status: DC
  Administered 2012-08-05 – 2012-08-07 (×3): 2 via ORAL

## 2012-08-05 MED ORDER — LANOLIN HYDROUS EX OINT: 1.0000 "application " | TOPICAL_OINTMENT | CUTANEOUS | Status: DC | PRN

## 2012-08-05 MED ORDER — OXYTOCIN 10 UNIT/ML IJ SOLN
40.0000 [IU] | INTRAVENOUS | Status: DC | PRN
  Administered 2012-08-05: 40 [IU] via INTRAVENOUS

## 2012-08-05 MED ORDER — NALOXONE HCL 0.4 MG/ML IJ SOLN: 0.4000 mg | INTRAMUSCULAR | Status: DC | PRN

## 2012-08-05 MED ORDER — PHENYLEPHRINE HCL 10 MG/ML IJ SOLN
INTRAMUSCULAR | Status: DC | PRN
  Administered 2012-08-05: 80 ug via INTRAVENOUS
  Administered 2012-08-05: 40 ug via INTRAVENOUS
  Administered 2012-08-05: 80 ug via INTRAVENOUS
  Administered 2012-08-05 (×2): 40 ug via INTRAVENOUS

## 2012-08-05 MED ORDER — MORPHINE SULFATE 0.5 MG/ML IJ SOLN
INTRAMUSCULAR | Status: AC
  Filled 2012-08-05: qty 10

## 2012-08-05 MED ORDER — ZOLPIDEM TARTRATE 5 MG PO TABS: 5.0000 mg | ORAL_TABLET | Freq: Every evening | ORAL | Status: DC | PRN

## 2012-08-05 MED ORDER — BETAMETHASONE SOD PHOS & ACET 6 (3-3) MG/ML IJ SUSP: 12.0000 mg | Freq: Once | INTRAMUSCULAR | Status: DC

## 2012-08-05 MED ORDER — DIPHENHYDRAMINE HCL 25 MG PO CAPS: 25.0000 mg | ORAL_CAPSULE | ORAL | Status: DC | PRN

## 2012-08-05 MED ORDER — MEPERIDINE HCL 25 MG/ML IJ SOLN: 6.2500 mg | INTRAMUSCULAR | Status: DC | PRN

## 2012-08-05 MED ORDER — ONDANSETRON HCL 4 MG/2ML IJ SOLN
INTRAMUSCULAR | Status: DC | PRN
  Administered 2012-08-05: 4 mg via INTRAVENOUS

## 2012-08-05 MED ORDER — ONDANSETRON HCL 4 MG/2ML IJ SOLN: 4.0000 mg | INTRAMUSCULAR | Status: DC | PRN

## 2012-08-05 MED ORDER — EPHEDRINE SULFATE 50 MG/ML IJ SOLN
INTRAMUSCULAR | Status: DC | PRN
  Administered 2012-08-05: 5 mg via INTRAVENOUS
  Administered 2012-08-05 (×3): 10 mg via INTRAVENOUS
  Administered 2012-08-05: 5 mg via INTRAVENOUS
  Administered 2012-08-05: 10 mg via INTRAVENOUS

## 2012-08-05 MED ORDER — PHENYLEPHRINE 40 MCG/ML (10ML) SYRINGE FOR IV PUSH (FOR BLOOD PRESSURE SUPPORT)
PREFILLED_SYRINGE | INTRAVENOUS | Status: AC
  Filled 2012-08-05: qty 5

## 2012-08-05 SURGICAL SUPPLY — 26 items
BARRIER ADHS 3X4 INTERCEED (GAUZE/BANDAGES/DRESSINGS) IMPLANT
CLOTH BEACON ORANGE TIMEOUT ST (SAFETY) ×2 IMPLANT
DRAPE LG THREE QUARTER DISP (DRAPES) ×2 IMPLANT
DRSG OPSITE POSTOP 4X10 (GAUZE/BANDAGES/DRESSINGS) ×2 IMPLANT
DURAPREP 26ML APPLICATOR (WOUND CARE) ×2 IMPLANT
ELECT REM PT RETURN 9FT ADLT (ELECTROSURGICAL) ×2
ELECTRODE REM PT RTRN 9FT ADLT (ELECTROSURGICAL) ×1 IMPLANT
EXTRACTOR VACUUM KIWI (MISCELLANEOUS) IMPLANT
GLOVE BIO SURGEON STRL SZ 6.5 (GLOVE) ×2 IMPLANT
GLOVE BIOGEL PI IND STRL 7.0 (GLOVE) ×3 IMPLANT
GLOVE BIOGEL PI INDICATOR 7.0 (GLOVE) ×3
GOWN STRL REIN XL XLG (GOWN DISPOSABLE) ×4 IMPLANT
KIT ABG SYR 3ML LUER SLIP (SYRINGE) IMPLANT
NEEDLE HYPO 25X5/8 SAFETYGLIDE (NEEDLE) IMPLANT
NS IRRIG 1000ML POUR BTL (IV SOLUTION) ×2 IMPLANT
PACK C SECTION WH (CUSTOM PROCEDURE TRAY) ×2 IMPLANT
PAD OB MATERNITY 4.3X12.25 (PERSONAL CARE ITEMS) ×2 IMPLANT
SLEEVE SCD COMPRESS KNEE LRG (MISCELLANEOUS) IMPLANT
SLEEVE SCD COMPRESS KNEE MED (MISCELLANEOUS) IMPLANT
SUT VIC AB 0 CT1 36 (SUTURE) ×12 IMPLANT
SUT VIC AB 2-0 CT1 27 (SUTURE) ×1
SUT VIC AB 2-0 CT1 TAPERPNT 27 (SUTURE) ×1 IMPLANT
SUT VIC AB 4-0 PS2 27 (SUTURE) ×2 IMPLANT
TOWEL OR 17X24 6PK STRL BLUE (TOWEL DISPOSABLE) ×6 IMPLANT
TRAY FOLEY CATH 14FR (SET/KITS/TRAYS/PACK) IMPLANT
WATER STERILE IRR 1000ML POUR (IV SOLUTION) ×2 IMPLANT

## 2012-08-05 NOTE — H&P (Signed)
Melody Williams is a 22 y.o. female presenting for primary cesarean section for twis, [redacted]w[redacted]d [place] yesterday showed IUGR <10%ile, and Dr. Claudean Severance spoke to her OB in [facility] and recommended delivery by cesarean section. Currently  [facility] does not handle deliveries <36 weeks, so Dr. Jolayne Panther scheduled surgery for today. Dr. Deretha Emory note [name], MD at 08/04/2012 4:06 PM   Status: Signed            Received phone call from Dr. Claudean Severance to accept care of this patient. This is a 22 yo G1P0 at 108w3d with Di-Di twins. She has been followed by MFM for poor growth in twin A. Twin A was found to be IUGR with EFW <10% tile and oligohydramnios today. Twin B has EFW at 18%tile with normal fluid. BPP for twin A 8/10 and twin B 10/10. In view of the oligohydramnios, MFM recommended delivery within the next 24-48 hours. Patient was planning on having a cesarean section but Ob from [facility] cannot deliver less than 36 weeks in their facility. Patient had a full mean of [employer] at 3:30pm. Dr. Claudean Severance does not feel that patient needs to remain overnight. She is scheduled for primary cesarean section at 14:30 on 3/28. Patient was advised to present at 12:30 pm. All questions were answered.           Maternal Medical History:  Reason for admission: Nausea. Cesarean section  Fetal activity: Perceived fetal activity is normal.    Prenatal complications: IUGR and oligohydramnios.   twins    OB History   Grav Para Term Preterm Abortions TAB SAB Ect Mult Living   1 0 0 0 0 0 0 0 0 0      Past Medical History  Diagnosis Date  . Eczema   . Acute meniscal tear of knee LEFT  . Family history of anesthesia complication    Past Surgical History  Procedure Laterality Date  . Knee arthroscopy  05/01/2011    Procedure: ARTHROSCOPY KNEE;  Surgeon: Javier Docker;  Location: [facility];  Service: Orthopedics;  Laterality: Left;  left knee arthroscopy with debridement ,removal of loose   bodies   Family History: family history is not on file. Social History:  reports that she has never smoked. She has never used smokeless tobacco. She reports that she does not drink alcohol or use illicit drugs.   Prenatal Transfer Tool  Maternal Diabetes: No Genetic Screening: Declined, not done Maternal Ultrasounds/Referrals: Abnormal:  Findings:   IUGR Fetal Ultrasounds or other Referrals:  Referred to Materal Fetal Medicine , Other: oligohydramnios and <10th %ile twin A, twin B 18%ile Maternal Substance Abuse:  No Significant Maternal Medications:  None Significant Maternal Lab Results:  None Other Comments:  None  Review of Systems  Constitutional: Negative for fever.  Respiratory: Positive for cough. Negative for sputum production and shortness of breath.   Gastrointestinal: Negative for nausea.      Last menstrual period 11/21/2011. Maternal Exam:  Abdomen: Fundal height is c/w twins.    Introitus: not evaluated.     Physical Exam  Constitutional: She is oriented to person, place, and time. She appears well-developed and well-nourished. No distress.  HENT:  Head: Normocephalic.  Neck: Normal range of motion.  Cardiovascular: Normal rate and regular rhythm.   Respiratory: Effort normal and breath sounds normal. No respiratory distress.  GI: Soft. There is no tenderness.  Neurological: She is alert and oriented to person, place, and time.  Skin: Skin is warm  and dry.  Psychiatric: She has a normal mood and affect. Her behavior is normal.    Prenatal labs: ABO, Rh: O/Positive/-- (10/15 0000) Antibody: Negative (10/15 0000) Rubella: Immune (10/15 0000) RPR: Nonreactive (10/15 0000)  HBsAg: Negative (10/15 0000)  HIV:    GBS:    CBC    Component Value Date/Time   WBC 10.0 08/05/2012 1248   RBC 3.76* 08/05/2012 1248   HGB 11.2* 08/05/2012 1248   HCT 33.8* 08/05/2012 1248   PLT 251 08/05/2012 1248   MCV 89.9 08/05/2012 1248   MCH 29.8 08/05/2012 1248   MCHC 33.1  08/05/2012 1248   RDW 14.5 08/05/2012 1248     Assessment/Plan: [redacted]w[redacted]d Twin pregnancy with IUGR and oligo twin A, scheduled for primary cesarean section. The procedure and risk of anesthesia, bleeding, infection, visceral organ damage discussed and questions answered. Consent signed.   [name] 08/05/2012, 12:50 PM

## 2012-08-05 NOTE — Op Note (Signed)
Procedure: Primary low transverse cesarean section Preoperative diagnosis: Intrauterine pregnancy 35 weeks 4 days gestation with twins. Twin A with oligohydramnios and growth restriction. Postoperative diagnosis: Intrauterine pregnancy delivered, viable twins Findings: Viable female twins, twin A cephalic, twin B transverse head right  Surgeon: Dr. Scheryl Darter Assistant: Dr. Lupita Leash Anesthesia: Spinal Estimated blood loss: 600 mL Drains: Foley catheter Counts: Correct Complications: None   Gave written consent for primary cesarean section to deliver twins at 35 weeks 4 days gestation. Ultrasound showed twin A oligohydramnios and growth restriction, estimated fetal weight less than 10th percentile. Patient identification was confirmed and consent was signed. Patient was brought to the OR and spinal anesthesia was induced. She's placed in dorsal supine position in the right lateral tilt. Foley catheter was placed and abdomen sterilely prepped and draped. 10 blade was used to make a Pfannenstiel incision down to the fascia. Fascia was incised the incision was extended transversely. Fascia was separated from underlying tissue attachments with blunt and sharp dissection. Bellies of the rectus muscles were separated. Peritoneum was entered at midline incision was extended vertically. Bladder blade was inserted. A bladder flap was created. 10 blade was used to make a incision in the lower uterine segment and this was extended transversely. Clear fluid was expressed. Fetal head was elevated and delivered. Infant was delivered atraumatically and the mouth and nose were cleared with bulb suction and cord was clamped and cut. Infant was brought to personnel. Uterus at birth and was a female. The uterus was explored and twin B was identified and the feet were grasped. The water was ruptured with clear fluid. It was delivered breech without difficulty. Mouth and nose were cleared with bulb suction. Loose  and nursery personnel. Sent was removed and uterine cavity was explored. Placenta was sent to pathology. Patient received IV Pitocin. Uterine and was closed with a running locking suture with 0 Vicryl. An imbricating layer followed with 0 Vicryl and good hemostasis was assured the left side and with interrupted 0 Vicryl suture. Both adnexa were inspected and were normal. Peritoneum was closed with a running suture with 2-0 Vicryl. Fascia was closed with a running suture with 0 Vicryl. Skin was irrigated hemostasis was seen. Skin was closed with subcuticular suture with 4-0 Vicryl. Sterile dressing was applied. Patient tolerated the procedure well without complications. She was brought in stable condition to PACU.    Dr. Scheryl Darter 08/06/2011 3:51 PM

## 2012-08-05 NOTE — Anesthesia Preprocedure Evaluation (Addendum)
Anesthesia Evaluation  Patient identified by MRN, date of birth, ID band Patient awake    Reviewed: Allergy & Precautions, H&P , NPO status , Patient's Chart, lab work & pertinent test results, reviewed documented beta blocker date and time   History of Anesthesia Complications Negative for: history of anesthetic complications  Airway Mallampati: III TM Distance: >3 FB Neck ROM: full    Dental  (+) Teeth Intact,    Pulmonary neg pulmonary ROS,  breath sounds clear to auscultation        Cardiovascular negative cardio ROS  Rhythm:regular Rate:Normal     Neuro/Psych negative neurological ROS  negative psych ROS   GI/Hepatic negative GI ROS, Neg liver ROS,   Endo/Other  negative endocrine ROS  Renal/GU negative Renal ROS  negative genitourinary   Musculoskeletal   Abdominal   Peds  Hematology negative hematology ROS (+)   Anesthesia Other Findings   Reproductive/Obstetrics (+) Pregnancy (twins, IUGR twin A)                          Anesthesia Physical Anesthesia Plan  ASA: II  Anesthesia Plan: Spinal   Post-op Pain Management:    Induction:   Airway Management Planned:   Additional Equipment:   Intra-op Plan:   Post-operative Plan:   Informed Consent: I have reviewed the patients History and Physical, chart, labs and discussed the procedure including the risks, benefits and alternatives for the proposed anesthesia with the patient or authorized representative who has indicated his/her understanding and acceptance.     Plan Discussed with: Surgeon and CRNA  Anesthesia Plan Comments:        Anesthesia Quick Evaluation

## 2012-08-05 NOTE — Anesthesia Postprocedure Evaluation (Signed)
  Anesthesia Post-op Note  Anesthesia Post Note  Patient: Melody Williams  Procedure(s) Performed: Procedure(s) (LRB): CESAREAN SECTION (N/A)  Anesthesia type: Spinal  Patient location: PACU  Post pain: Pain level controlled  Post assessment: Post-op Vital signs reviewed  Last Vitals:  Filed Vitals:   08/05/12 1700  BP:   Pulse: 62  Temp: 36.3 C  Resp: 20    Post vital signs: Reviewed  Level of consciousness: awake  Complications: No apparent anesthesia complications

## 2012-08-05 NOTE — Transfer of Care (Signed)
Immediate Anesthesia Transfer of Care Note  Patient: Melody Williams  Procedure(s) Performed: Procedure(s) with comments: CESAREAN SECTION (N/A) - Twins  Patient Location: PACU  Anesthesia Type:Spinal  Level of Consciousness: awake, alert  and oriented  Airway & Oxygen Therapy: Patient Spontanous Breathing  Post-op Assessment: Report given to PACU RN and Post -op Vital signs reviewed and stable  Post vital signs: stable  Complications: No apparent anesthesia complications

## 2012-08-05 NOTE — Anesthesia Procedure Notes (Signed)
Spinal  Patient location during procedure: OR Start time: 08/05/2012 2:31 PM Staffing Performed by: anesthesiologist  Preanesthetic Checklist Completed: patient identified, site marked, surgical consent, pre-op evaluation, timeout performed, IV checked, risks and benefits discussed and monitors and equipment checked Spinal Block Patient position: sitting Prep: site prepped and draped and DuraPrep Patient monitoring: heart rate, cardiac monitor, continuous pulse ox and blood pressure Approach: midline Location: L3-4 Injection technique: single-shot Needle Needle type: Sprotte  Needle gauge: 24 G Needle length: 9 cm Assessment Sensory level: T4 Additional Notes Clear free flow CSF on first attempt.  No paresthesia.  Patient tolerated procedure well with no apparent complications.  Jasmine December, MD

## 2012-08-06 LAB — CBC
Hemoglobin: 9.2 g/dL — ABNORMAL LOW (ref 12.0–15.0)
MCH: 29.7 pg (ref 26.0–34.0)
RBC: 3.1 MIL/uL — ABNORMAL LOW (ref 3.87–5.11)

## 2012-08-06 NOTE — Plan of Care (Signed)
Problem: Phase II Progression Outcomes Goal: Progress activity as tolerated unless otherwise ordered Outcome: Progressing Went to NICU in wheelchair tolerated well

## 2012-08-06 NOTE — Progress Notes (Signed)
Subjective:Post op pain  Postpartum Day 1: Cesarean Delivery Patient reports incisional pain, tolerating PO and no problems voiding.    Objective: Vital signs in last 24 hours: Temp:  [96.6 F (35.9 C)-98.3 F (36.8 C)] 97.8 F (36.6 C) (03/29 0531) Pulse Rate:  [57-99] 98 (03/29 0531) Resp:  [16-23] 16 (03/29 0531) BP: (102-135)/(44-77) 105/55 mmHg (03/29 0531) SpO2:  [96 %-100 %] 100 % (03/29 0531) Weight:  [166 lb (75.297 kg)] 166 lb (75.297 kg) (03/28 1303)  Physical Exam:  General: alert, cooperative and no distress Lochia: appropriate Uterine Fundus: firm Incision: healing well, no significant drainage DVT Evaluation: No evidence of DVT seen on physical exam.SCD in place   Recent Labs  08/05/12 1248 08/06/12 0535  HGB 11.2* 9.2*  HCT 33.8* 27.8*    Assessment/Plan: Status post Cesarean section. Doing well postoperatively.  Continue current care.  Carma Dwiggins 08/06/2012, 7:53 AM

## 2012-08-06 NOTE — Plan of Care (Signed)
Problem: Phase I Progression Outcomes Goal: Voiding adequately Outcome: Completed/Met Date Met:  08/06/12 Has voided qs times 3 clear amber urine

## 2012-08-06 NOTE — Plan of Care (Signed)
Problem: Phase I Progression Outcomes Goal: Pain controlled with appropriate interventions Outcome: Completed/Met Date Met:  08/06/12 Has not needed any pain medication Goal: Voiding adequately Outcome: Not Progressing Foley in place Goal: Foley catheter patent Outcome: Completed/Met Date Met:  08/06/12 Clear yellow quantity sufficient  Goal: OOB as tolerated unless otherwise ordered Outcome: Completed/Met Date Met:  08/06/12 Got up in wheelchair and went to NICU and tolerated very well Goal: Initial discharge plan identified Outcome: Completed/Met Date Met:  08/06/12 VSS Pain controlled Self care and when to call the MD understood F/U care understood    Goal: Other Phase I Outcomes/Goals Outcome: Not Progressing Foley needs to D/C and patient void

## 2012-08-06 NOTE — Plan of Care (Signed)
Problem: Phase I Progression Outcomes Goal: Other Phase I Outcomes/Goals Outcome: Completed/Met Date Met:  08/06/12 Continue to stress breast care since patient does not plan to breast feed.

## 2012-08-06 NOTE — Anesthesia Postprocedure Evaluation (Signed)
  Anesthesia Post-op Note  Patient: Melody Williams  Procedure(s) Performed: Procedure(s) with comments: CESAREAN SECTION (N/A) - Twins  Patient Location: [facility] Unit  Anesthesia Type:Spinal  Level of Consciousness: awake, alert  and oriented  Airway and Oxygen Therapy: Patient Spontanous Breathing  Post-op Pain: mild  Post-op Assessment: Patient's Cardiovascular Status Stable, Respiratory Function Stable, Patent Airway, No signs of Nausea or vomiting and Pain level controlled  Post-op Vital Signs: stable  Complications: No apparent anesthesia complications

## 2012-08-07 MED ORDER — FERROUS SULFATE 325 (65 FE) MG PO TABS
325.0000 mg | ORAL_TABLET | Freq: Every day | ORAL | Status: DC
  Administered 2012-08-08: 325 mg via ORAL
  Filled 2012-08-07 (×2): qty 1

## 2012-08-07 NOTE — Lactation Note (Signed)
This note was copied from the chart of GirlA Taisley Matranga. Lactation Consultation Note  Patient Name: Keyonta Madrid [id number] Date: 08/07/2012 Reason for consult: Follow-up assessment   Maternal Data Formula Feeding for Exclusion: Yes Reason for exclusion: Admission to Intensive Care Unit (ICU) post-partum  Feeding   LATCH Score/Interventions                      Lactation Tools Discussed/Used     Consult Status Consult Status: Follow-up Date: 08/08/12 Follow-up type: In-patient  Mom reports that she pumped this morning and obtained about 5 cc's of EBM and took it to the babies. Reports that she pumped twice yesterday. Encouraged to pump q 3 hours to promote milk supply. No questions at present. To call prn  Pamelia Hoit 08/07/2012, 1:59 PM

## 2012-08-07 NOTE — Clinical Social Work Note (Signed)
Clinical Social Work Department PSYCHOSOCIAL ASSESSMENT - MATERNAL/CHILD 08/07/2012  Patient:  Melody Williams, Melody Williams  Account Number:  [id number]  Admit Date:  08/05/2012  Marjo Bicker Name:   Thurnell Garbe    Clinical Social Worker:  Truman [place], [place]   Date/Time:  08/07/2012 03:00 PM  Date Referred:  08/07/2012   Referral source  Physician     Referred reason  NICU   Other referral source:    I:  FAMILY / HOME ENVIRONMENT Child's legal guardian:  PARENT  Guardian - Name Guardian - Age Guardian - Address  Geraldene Eisel [address] [place] [address]  Marijean Bravo     Other household support members/support persons Name Relationship DOB  Purvis Sheffield GRAND MOTHER   5 other siblings     Other support:   MOB and FOB report good family support    II  PSYCHOSOCIAL DATA Information Source:  Patient Interview  Insurance claims handler Resources Employment:   MOB and FOB both [employer]   Financial resources:  Medicaid If Medicaid - County:  [place] Other  WIC   School / Grade:   Maternity Care Coordinator / Child Services Coordination / Early Interventions:  Cultural issues impacting care:    III  STRENGTHS Strengths  Adequate Resources  Compliance with medical plan  Understanding of illness  Supportive family/friends   Strength comment:    IV  RISK FACTORS AND CURRENT PROBLEMS Current Problem:  None   Risk Factor & Current Problem Patient Issue Family Issue Risk Factor / Current Problem Comment   N N     V  SOCIAL WORK ASSESSMENT CSW spoke with MOB and FOB in room.  CSW discussed emotions around infant admission to NICU.  MOB reports appropriate emotions and no significant concerns.  MOB reported understanding admission to NICU and good communication with nurses and doctors.  CSW went over PPD symptoms and provided MOB with feelings after birth brochure.  CSW discussed supplies and family support.  MOB reports she lives with her  mother and siblings currently.  MOB reports no concerns with family support.  MOB voiced concern about being able to get crib for infant.  CSW discussed possible resources and will follow-up with [facility] and pack-n-play.  CSW discussed medicaid.  MOB reports looking into [facility] for other resources.  No other concerns expressed at this time.  CSW will continue to follow while infant in the NICU.    VI SOCIAL WORK PLAN  Social Work Plan  Psychosocial Support/Ongoing Assessment of Needs    Type of pt/family education:  PPD    If child protective services report - county:  If child protective services report - date:  Information/referral to community resources comment:  Personal assistant  WIC    Other social work plan:      VI Editor, commissioning Social Work Plan  Psychosocial Support/Ongoing Assessment of Needs   Type of pt/family education:   PPD   If child protective services report - county:   If child protective services report - date:   Information/referral to community resources comment:   Other social work plan:

## 2012-08-07 NOTE — Progress Notes (Signed)
Subjective: Postpartum Day 2: Cesarean Delivery, Twins Patient reports incisional pain, tolerating PO, + flatus and no problems voiding.    Objective: Vital signs in last 24 hours: Temp:  [97.7 F (36.5 C)-98.3 F (36.8 C)] 98.3 F (36.8 C) (03/30 2130) Pulse Rate:  [53-89] 60 (03/30 0613) Resp:  [15-18] 15 (03/30 0613) BP: (94-115)/(55-70) 94/60 mmHg (03/30 0613) SpO2:  [98 %-100 %] 100 % (03/30 8657)  Physical Exam:  General: alert, cooperative and no distress Lochia: appropriate Uterine Fundus: firm Incision: healing well, no significant drainage, no dehiscence, no significant erythema DVT Evaluation: No evidence of DVT seen on physical exam. Negative Homan's sign. No cords or calf tenderness. No significant calf/ankle edema.   Recent Labs  08/05/12 1248 08/06/12 0535  HGB 11.2* 9.2*  HCT 33.8* 27.8*    Assessment/Plan: Status post Cesarean section. Doing well postoperatively.  Continue current care. FeSO4 for anemia Discharge home tomorrow. Babies remain in NICU. Breastfeeding (pumping)  Napoleon Form 08/07/2012, 8:37 AM

## 2012-08-08 ENCOUNTER — Encounter (HOSPITAL_COMMUNITY): Payer: Self-pay | Admitting: Obstetrics & Gynecology

## 2012-08-08 MED ORDER — IBUPROFEN 600 MG PO TABS: 600.0000 mg | ORAL_TABLET | Freq: Four times a day (QID) | ORAL | Status: DC | PRN

## 2012-08-08 MED ORDER — DOCUSATE SODIUM 100 MG PO CAPS: 100.0000 mg | ORAL_CAPSULE | Freq: Two times a day (BID) | ORAL | Status: DC | PRN

## 2012-08-08 MED ORDER — FERROUS SULFATE 325 (65 FE) MG PO TABS: 325.0000 mg | ORAL_TABLET | Freq: Two times a day (BID) | ORAL | Status: DC

## 2012-08-08 MED ORDER — OXYCODONE-ACETAMINOPHEN 5-325 MG PO TABS: 1.0000 | ORAL_TABLET | ORAL | Status: DC | PRN

## 2012-08-08 NOTE — Discharge Summary (Signed)
Obstetric Discharge Summary Reason for Admission: cesarean section Prenatal Procedures: none Intrapartum Procedures: cesarean: low cervical, transverse Postpartum Procedures: none Complications-Operative and Postpartum: none Hemoglobin  Date Value Range Status  08/06/2012 9.2* 12.0 - 15.0 g/dL Final     REPEATED TO VERIFY     DELTA CHECK NOTED     HCT  Date Value Range Status  08/06/2012 27.8* 36.0 - 46.0 % Final    Physical Exam:  General: alert, cooperative and no distress Lochia: appropriate Uterine Fundus: soft Incision: no significant drainage, no significant erythema DVT Evaluation: No evidence of DVT seen on physical exam. Negative Homan's sign. No cords or calf tenderness.  Discharge Diagnoses: Pre-Term Twin C section, 35.4 weeks  Discharge Information: Date: 08/08/2012 Activity: pelvic rest Diet: routine Medications: PNV, Ibuprofen, Colace, Iron and Percocet Condition: stable Instructions: refer to practice specific booklet Discharge to: home Follow-up Information   Follow up with Dr Delford Field, [facility] . (Call for postpartum appt in 4-6 weeks)       Follow up with [facility]. (you will be called for appt in 1 week or less for depo injection)    Contact information:   [address] [place] [address] [phone]      Newborn Data:   Avree, Szczygiel [[id number]]  Live born female  Birth Weight: 3 lb 13.4 oz (1741 g) APGAR: 5, 7   Zamani, Crocker [[id number]]  Live born female  Birth Weight: 4 lb 6.6 oz (2000 g) APGAR: 7, 8  Babies in NICU.  Kevin Fenton 08/08/2012, 9:23 AM

## 2012-08-08 NOTE — Progress Notes (Signed)
Ur chart review completed.  

## 2012-08-08 NOTE — Progress Notes (Signed)
Patient discharged via ambulation. Prescriptions given.

## 2012-08-09 ENCOUNTER — Encounter (HOSPITAL_COMMUNITY): Payer: Self-pay | Admitting: *Deleted

## 2012-08-10 NOTE — Discharge Summary (Signed)
Pt seen and examined. Agree with above note.  Melody Williams H. 08/10/2012 11:25 AM

## 2012-08-11 ENCOUNTER — Ambulatory Visit (HOSPITAL_COMMUNITY): Payer: Medicaid Other

## 2012-08-15 ENCOUNTER — Ambulatory Visit (INDEPENDENT_AMBULATORY_CARE_PROVIDER_SITE_OTHER): Payer: Medicaid Other | Admitting: *Deleted

## 2012-08-15 VITALS — BP 115/76 | HR 80 | Temp 98.6°F | Wt 146.0 lb

## 2012-08-15 DIAGNOSIS — Z3049 Encounter for surveillance of other contraceptives: Secondary | ICD-10-CM

## 2012-08-15 MED ORDER — MEDROXYPROGESTERONE ACETATE 150 MG/ML IM SUSP
150.0000 mg | Freq: Once | INTRAMUSCULAR | Status: AC
  Administered 2012-08-15: 150 mg via INTRAMUSCULAR

## 2012-08-15 MED ORDER — MEDROXYPROGESTERONE ACETATE 150 MG/ML IM SUSP: 150.0000 mg | INTRAMUSCULAR | Status: DC

## 2012-08-15 NOTE — Progress Notes (Signed)
Had C/S 08/05/12 and dc'd 08/08/12 . Here for depoprovera injection. States has not had intercourse since delivery.

## 2012-10-31 ENCOUNTER — Ambulatory Visit: Payer: Medicaid Other

## 2014-01-01 DIAGNOSIS — F32A Depression, unspecified: Secondary | ICD-10-CM | POA: Insufficient documentation

## 2014-01-01 DIAGNOSIS — F329 Major depressive disorder, single episode, unspecified: Secondary | ICD-10-CM | POA: Insufficient documentation

## 2014-03-12 ENCOUNTER — Encounter (HOSPITAL_COMMUNITY): Payer: Self-pay | Admitting: *Deleted

## 2014-04-11 DIAGNOSIS — Z3040 Encounter for surveillance of contraceptives, unspecified: Secondary | ICD-10-CM | POA: Insufficient documentation

## 2015-12-14 ENCOUNTER — Encounter (HOSPITAL_COMMUNITY): Payer: Self-pay | Admitting: *Deleted

## 2015-12-14 ENCOUNTER — Emergency Department (HOSPITAL_COMMUNITY)
Admission: EM | Admit: 2015-12-14 | Discharge: 2015-12-14 | Disposition: A | Discharge status: Home / Self Care | Payer: Medicaid Other | Attending: Emergency Medicine | Admitting: Emergency Medicine

## 2015-12-14 DIAGNOSIS — B373 Candidiasis of vulva and vagina: Secondary | ICD-10-CM | POA: Insufficient documentation

## 2015-12-14 DIAGNOSIS — N898 Other specified noninflammatory disorders of vagina: Secondary | ICD-10-CM | POA: Diagnosis not present

## 2015-12-14 DIAGNOSIS — R55 Syncope and collapse: Secondary | ICD-10-CM | POA: Insufficient documentation

## 2015-12-14 DIAGNOSIS — B3731 Acute candidiasis of vulva and vagina: Secondary | ICD-10-CM

## 2015-12-14 DIAGNOSIS — F410 Panic disorder [episodic paroxysmal anxiety] without agoraphobia: Secondary | ICD-10-CM | POA: Diagnosis not present

## 2015-12-14 HISTORY — DX: Anxiety disorder, unspecified: F41.9

## 2015-12-14 LAB — CBC WITH DIFFERENTIAL/PLATELET
BASOS ABS: 0 10*3/uL (ref 0.0–0.1)
Basophils Relative: 0 %
EOS PCT: 2 %
Eosinophils Absolute: 0.2 10*3/uL (ref 0.0–0.7)
HCT: 40.7 % (ref 36.0–46.0)
Hemoglobin: 13.4 g/dL (ref 12.0–15.0)
LYMPHS ABS: 3.5 10*3/uL (ref 0.7–4.0)
LYMPHS PCT: 39 %
MCH: 30 pg (ref 26.0–34.0)
MCHC: 32.9 g/dL (ref 30.0–36.0)
MCV: 91.1 fL (ref 78.0–100.0)
MONO ABS: 0.5 10*3/uL (ref 0.1–1.0)
MONOS PCT: 5 %
Neutro Abs: 4.9 10*3/uL (ref 1.7–7.7)
Neutrophils Relative %: 54 %
PLATELETS: 275 10*3/uL (ref 150–400)
RBC: 4.47 MIL/uL (ref 3.87–5.11)
RDW: 12 % (ref 11.5–15.5)
WBC: 9.1 10*3/uL (ref 4.0–10.5)

## 2015-12-14 LAB — COMPREHENSIVE METABOLIC PANEL
ALT: 25 U/L (ref 14–54)
AST: 24 U/L (ref 15–41)
Albumin: 4.5 g/dL (ref 3.5–5.0)
Alkaline Phosphatase: 48 U/L (ref 38–126)
Anion gap: 7 (ref 5–15)
BILIRUBIN TOTAL: 0.7 mg/dL (ref 0.3–1.2)
BUN: 8 mg/dL (ref 6–20)
CHLORIDE: 107 mmol/L (ref 101–111)
CO2: 26 mmol/L (ref 22–32)
Calcium: 9.7 mg/dL (ref 8.9–10.3)
Creatinine, Ser: 0.87 mg/dL (ref 0.44–1.00)
Glucose, Bld: 94 mg/dL (ref 65–99)
POTASSIUM: 3.6 mmol/L (ref 3.5–5.1)
Sodium: 140 mmol/L (ref 135–145)
TOTAL PROTEIN: 8.1 g/dL (ref 6.5–8.1)

## 2015-12-14 LAB — WET PREP, GENITAL
Sperm: NONE SEEN
TRICH WET PREP: NONE SEEN

## 2015-12-14 LAB — URINALYSIS, ROUTINE W REFLEX MICROSCOPIC
BILIRUBIN URINE: NEGATIVE
GLUCOSE, UA: NEGATIVE mg/dL
Hgb urine dipstick: NEGATIVE
KETONES UR: NEGATIVE mg/dL
LEUKOCYTES UA: NEGATIVE
NITRITE: NEGATIVE
PROTEIN: NEGATIVE mg/dL
Specific Gravity, Urine: 1.014 (ref 1.005–1.030)
pH: 5.5 (ref 5.0–8.0)

## 2015-12-14 LAB — I-STAT BETA HCG BLOOD, ED (MC, WL, AP ONLY)

## 2015-12-14 MED ORDER — FLUCONAZOLE 150 MG PO TABS: 150.0000 mg | ORAL_TABLET | Freq: Every day | ORAL | 1 refills | Status: DC

## 2015-12-14 NOTE — ED Triage Notes (Signed)
Patient was visiting a family member on 3 in ICU and all of a sudden became jittery and felt hot.  States has a history of anxiety but not sure if this is the same.  Patient alert and states she is feeling some better.  Last ate prior to having this feeling

## 2015-12-14 NOTE — ED Triage Notes (Signed)
Vaginal discharge per patient  No urinary symptoms

## 2015-12-14 NOTE — ED Provider Notes (Signed)
[facility]-EMERGENCY DEPT Provider Note   CSN: [id number] Arrival date & time: 12/14/15  2059  First Provider Contact:   First MD Initiated Contact with Patient 12/14/15 2202        By signing my name below, I, Doreatha Martin, attest that this documentation has been prepared under the direction and in the presence of  Khyson Sebesta, PA-C. Electronically Signed: Doreatha Martin, ED Scribe. 12/14/15. 10:10 PM.    History   Chief Complaint Chief Complaint  Patient presents with  . Panic Attack    HPI Melody Williams is a 25 y.o. female with h/o anxiety who presents to the Emergency Department complaining of an episode of feeling anxious that occurred today while visiting a family member in the hospital. Pt states she felt "jittery", "heavy" and lightheaded after she ate a meal, and reports this is consistent with her prior anxiety attacks. She denies LOC. She also reports transiently feeling CP and SOB today, but reports this has now completely resolved. Per pt, she feels that her symptoms are improving and she only has some residual "jitters". She denies nausea, emesis.    She also complains of non-odorous white vaginal discharge x3 days. She also reports that she experienced dyspareunia prior to the onset of her discharge, but denies any since. Pt is sexually active. She denies fever, chills, vaginal bleeding, abdominal pain, pelvic pain.   The history is provided by the patient. No language interpreter was used.    Past Medical History:  Diagnosis Date  . Acute meniscal tear of knee LEFT  . Anxiety   . Eczema     Patient Active Problem List   Diagnosis Date Noted  . Twin pregnancy, antepartum 08/05/2012  . Fetal growth restriction 08/05/2012    Past Surgical History:  Procedure Laterality Date  . CESAREAN SECTION N/A 08/05/2012   Procedure: CESAREAN SECTION;  Surgeon: Adam Phenix, MD;  Location: [facility];  Service: Obstetrics;  Laterality: N/A;  Twins  . KNEE ARTHROSCOPY  05/01/2011   Procedure: ARTHROSCOPY KNEE;  Surgeon: Javier Docker;  Location: [facility];  Service: Orthopedics;  Laterality: Left;  left knee arthroscopy with debridement ,removal of loose  bodies    OB History    Gravida Para Term Preterm AB Living   1 1 0 1 0 2   SAB TAB Ectopic Multiple Live Births   0 0 0 1 2       Home Medications    Prior to Admission medications   Medication Sig Start Date End Date Taking? Authorizing Provider  docusate sodium (COLACE) 100 MG capsule Take 1 capsule (100 mg total) by mouth 2 (two) times daily as needed for constipation. 08/08/12   Elenora Gamma, MD  ferrous sulfate 325 (65 FE) MG tablet Take 1 tablet (325 mg total) by mouth 2 (two) times daily. 08/08/12   Elenora Gamma, MD  ibuprofen (ADVIL,MOTRIN) 600 MG tablet Take 1 tablet (600 mg total) by mouth every 6 (six) hours as needed for pain. 08/08/12   Elenora Gamma, MD  medroxyPROGESTERone (DEPO-PROVERA) 150 MG/ML injection Inject 1 mL (150 mg total) into the muscle every 3 (three) months. 08/15/12   Willodean Rosenthal, MD  oxyCODONE-acetaminophen (PERCOCET/ROXICET) 5-325 MG per tablet Take 1 tablet by mouth every 4 (four) hours as needed. 08/08/12   Elenora Gamma, MD  Prenatal Vit w/Fe-Methylfol-FA (PNV PO) Take by mouth.    Historical Provider, MD    Family History No family history on file.  Social History Social History  Substance Use Topics  . Smoking status: Never Smoker  . Smokeless tobacco: Never Used  . Alcohol use No     Allergies   Citrus and Peanut-containing drug products   Review of Systems Review of Systems  Constitutional: Negative for chills and fever.  Respiratory: Positive for shortness of breath (resolved).   Cardiovascular: Positive for chest pain (resolved).  Gastrointestinal: Negative for abdominal pain, nausea and vomiting.  Genitourinary: Positive for dyspareunia (resolved) and vaginal discharge. Negative for pelvic pain and vaginal  bleeding.  Neurological: Positive for light-headedness (resolved).  Psychiatric/Behavioral: The patient is nervous/anxious.      Physical Exam Updated Vital Signs BP 157/85 (BP Location: Right Arm)   Pulse 73   Temp 97.7 F (36.5 C) (Oral)   Resp 16   Ht 5\' 3"  (1.6 m)   Wt 144 lb 8 oz (65.5 kg)   SpO2 100%   BMI 25.60 kg/m   Physical Exam  Constitutional: She appears well-developed and well-nourished.  HENT:  Head: Normocephalic.  Eyes: Conjunctivae are normal. Pupils are equal, round, and reactive to light.  Cardiovascular: Normal rate, regular rhythm and normal heart sounds.   No murmur heard. Pulmonary/Chest: Effort normal and breath sounds normal. No respiratory distress. She has no wheezes. She has no rales.  Lungs CTA bilaterally.   Genitourinary: Uterus is not tender. Cervix exhibits no motion tenderness. Right adnexum displays no mass, no tenderness and no fullness. Left adnexum displays no mass, no tenderness and no fullness. Vaginal discharge found.  Genitourinary Comments: Scant amount of thick whitish colored vaginal discharge in the vaginal vault. Chaperone present throughout entire exam.    Musculoskeletal: Normal range of motion.  Neurological: She is alert. No cranial nerve deficit.  Cranial nerves 2-12 intact. Grip strength 5/5 bilaterally.   Skin: Skin is warm and dry.  Psychiatric: She has a normal mood and affect. Her behavior is normal.  Nursing note and vitals reviewed.    ED Treatments / Results  Labs (all labs ordered are listed, but only abnormal results are displayed) Labs Reviewed  CBC WITH DIFFERENTIAL/PLATELET  COMPREHENSIVE METABOLIC PANEL  URINALYSIS, ROUTINE W REFLEX MICROSCOPIC (NOT AT [facility])  I-STAT BETA HCG BLOOD, ED ([facility], [facility], [facility] ONLY)    EKG  EKG Interpretation None       Radiology No results found.  Procedures  Procedures (including critical care time)  DIAGNOSTIC STUDIES: Oxygen Saturation is 100% on RA, normal by my  interpretation.    COORDINATION OF CARE: 10:08 PM Discussed treatment plan with pt at bedside which includes lab work and pt agreed to plan.    Medications Ordered in ED Medications - No data to display   Initial Impression / Assessment and Plan / ED Course  I have reviewed the triage vital signs and the nursing notes.  Pertinent labs & imaging results that were available during my care of the patient were reviewed by me and considered in my medical decision making (see chart for details).  Clinical Course    Jamison Bacarella presents to the ED for evaluation of vaginal discharge. Pt offered HIV testing, but declined. Patient will be sent home with Rx for Diflucan. Conservative therapies discussed and recommended. Patient advised to follow up with PCP as needed, or with worsening symptoms. Patient appears stable for discharge at this time. Return precautions discussed and outlined in discharge paperwork. Patient is agreeable to plan.     Final Clinical Impressions(s) / ED Diagnoses   Final  diagnoses:  None    New Prescriptions New Prescriptions   No medications on file    I personally performed the services described in this documentation, which was scribed in my presence. The recorded information has been reviewed and is accurate.    Santiago Glad, PA-C 12/16/15 2059    Geoffery Lyons, MD 12/17/15 0700

## 2015-12-16 LAB — GC/CHLAMYDIA PROBE AMP (~~LOC~~) NOT AT ARMC
CHLAMYDIA, DNA PROBE: NEGATIVE
Neisseria Gonorrhea: NEGATIVE

## 2017-07-19 ENCOUNTER — Emergency Department (HOSPITAL_COMMUNITY)
Admission: EM | Admit: 2017-07-19 | Discharge: 2017-07-19 | Disposition: A | Discharge status: Home / Self Care | Payer: Medicaid Other | Attending: Emergency Medicine | Admitting: Emergency Medicine

## 2017-07-19 ENCOUNTER — Encounter (HOSPITAL_COMMUNITY): Payer: Self-pay

## 2017-07-19 ENCOUNTER — Other Ambulatory Visit: Payer: Self-pay

## 2017-07-19 DIAGNOSIS — K644 Residual hemorrhoidal skin tags: Secondary | ICD-10-CM | POA: Diagnosis present

## 2017-07-19 DIAGNOSIS — Z79899 Other long term (current) drug therapy: Secondary | ICD-10-CM | POA: Insufficient documentation

## 2017-07-19 DIAGNOSIS — K5900 Constipation, unspecified: Secondary | ICD-10-CM | POA: Insufficient documentation

## 2017-07-19 MED ORDER — OXYCODONE-ACETAMINOPHEN 5-325 MG PO TABS: 1.0000 | ORAL_TABLET | Freq: Three times a day (TID) | ORAL | 0 refills | Status: DC | PRN

## 2017-07-19 MED ORDER — POLYETHYLENE GLYCOL 3350 17 G PO PACK: 17.0000 g | PACK | Freq: Every day | ORAL | 0 refills | Status: DC

## 2017-07-19 MED ORDER — HYDROCORTISONE 2.5 % RE CREA: TOPICAL_CREAM | RECTAL | 0 refills | Status: DC

## 2017-07-19 NOTE — Discharge Instructions (Signed)
Please read attached information regarding her condition. Apply cream as directed to area.  Take laxatives to help with constipation. Take pain medication as needed for pain. Return to ED for worsening symptoms, increased pain with bowel movements, fevers.

## 2017-07-19 NOTE — ED Provider Notes (Addendum)
[facility] EMERGENCY DEPARTMENT Provider Note   CSN: [id number] Arrival date & time: 07/19/17  1650     History   Chief Complaint Chief Complaint  Patient presents with  . Hemorrhoids    HPI Melody Williams is a 27 y.o. female who presents to ED for evaluation of 2-day history of pain to rectal area that she believes is due to a hemorrhoid.  She does have a history of One hemorrhoid in the past but states that this feels a lot worse.  Her symptoms began after a 5-day fast last week.  She believes that she "ate too much too fast" which caused her to be constipated and straining with her bowel movements.  She has not had a bowel movement for about 2 days.  She has tried warm compresses to the area with no improvement in her symptoms.  Denies any fevers, blood in stool, urinary symptoms, abdominal pain, drainage from site, history of perirectal abscesses.  HPI  Past Medical History:  Diagnosis Date  . Acute meniscal tear of knee LEFT  . Anxiety   . Eczema     Patient Active Problem List   Diagnosis Date Noted  . Twin pregnancy, antepartum 08/05/2012  . Fetal growth restriction 08/05/2012    Past Surgical History:  Procedure Laterality Date  . CESAREAN SECTION N/A 08/05/2012   Procedure: CESAREAN SECTION;  Surgeon: Adam Phenix, MD;  Location: [facility];  Service: Obstetrics;  Laterality: N/A;  Twins  . KNEE ARTHROSCOPY  05/01/2011   Procedure: ARTHROSCOPY KNEE;  Surgeon: Javier Docker;  Location: [facility];  Service: Orthopedics;  Laterality: Left;  left knee arthroscopy with debridement ,removal of loose  bodies    OB History    Gravida Para Term Preterm AB Living   1 1 0 1 0 2   SAB TAB Ectopic Multiple Live Births   0 0 0 1 2       Home Medications    Prior to Admission medications   Medication Sig Start Date End Date Taking? Authorizing Provider  docusate sodium (COLACE) 100 MG capsule Take 1 capsule (100 mg total) by mouth 2  (two) times daily as needed for constipation. 08/08/12   Elenora Gamma, MD  ferrous sulfate 325 (65 FE) MG tablet Take 1 tablet (325 mg total) by mouth 2 (two) times daily. 08/08/12   Elenora Gamma, MD  fluconazole (DIFLUCAN) 150 MG tablet Take 1 tablet (150 mg total) by mouth daily. 12/14/15   Santiago Glad, PA-C  hydrocortisone (ANUSOL-HC) 2.5 % rectal cream Apply rectally 2 times daily 07/19/17   Kymberli Wiegand, PA-C  ibuprofen (ADVIL,MOTRIN) 600 MG tablet Take 1 tablet (600 mg total) by mouth every 6 (six) hours as needed for pain. 08/08/12   Elenora Gamma, MD  medroxyPROGESTERone (DEPO-PROVERA) 150 MG/ML injection Inject 1 mL (150 mg total) into the muscle every 3 (three) months. 08/15/12   Willodean Rosenthal, MD  oxyCODONE-acetaminophen (PERCOCET/ROXICET) 5-325 MG tablet Take 1 tablet by mouth every 8 (eight) hours as needed for severe pain. 07/19/17   Dinora Hemm, PA-C  polyethylene glycol (MIRALAX / GLYCOLAX) packet Take 17 g by mouth daily. 07/19/17   Klara Stjames, Hillary Bow, PA-C  Prenatal Vit w/Fe-Methylfol-FA (PNV PO) Take by mouth.    [provider]    Family History History reviewed. No pertinent family history.  Social History Social History   Tobacco Use  . Smoking status: Never Smoker  . Smokeless tobacco: Never Used  Substance Use Topics  . Alcohol use: No  . Drug use: No     Allergies   Citrus and Peanut-containing drug products   Review of Systems Review of Systems  Constitutional: Negative for chills and fever.  Gastrointestinal: Positive for constipation. Negative for blood in stool and diarrhea.  Genitourinary:       +rectal pain  Skin: Negative for wound.     Physical Exam Updated Vital Signs BP 104/76 (BP Location: Right Arm)   Pulse 64   Temp 98.1 F (36.7 C) (Oral)   Resp 18   LMP 06/24/2017 (Within Days)   SpO2 100%   Physical Exam  Constitutional: She appears well-developed and well-nourished. No distress.  Appears  uncomfortable.  HENT:  Head: Normocephalic and atraumatic.  Eyes: Conjunctivae and EOM are normal. No scleral icterus.  Neck: Normal range of motion.  Pulmonary/Chest: Effort normal. No respiratory distress.  Genitourinary: Rectal exam shows external hemorrhoid and tenderness. Rectal exam shows no internal hemorrhoid, no fissure and no mass.  Genitourinary Comments: Examination done under RN serving as chaperone.  There is a large, external, nonthrombosed hemorrhoid noted.  Tender to palpation.  Neurological: She is alert.  Skin: No rash noted. She is not diaphoretic.  Psychiatric: She has a normal mood and affect.  Nursing note and vitals reviewed.    ED Treatments / Results  Labs (all labs ordered are listed, but only abnormal results are displayed) Labs Reviewed - No data to display  EKG  EKG Interpretation None       Radiology No results found.  Procedures Procedures (including critical care time)  Medications Ordered in ED Medications - No data to display   Initial Impression / Assessment and Plan / ED Course  I have reviewed the triage vital signs and the nursing notes.  Pertinent labs & imaging results that were available during my care of the patient were reviewed by me and considered in my medical decision making (see chart for details).     Patient presents to ED for evaluation of hemorrhoid.  She does have a history of one hemorrhoid in the past but states that this feels worse than her previous.  She has also not had a bowel movement in about 2 days which she attributes to a 5-day long fast that she completed.  On physical exam there is a large, external, nonthrombosed hemorrhoid noted.  It is tender to palpation.  She denies any diarrhea, medically sick, melena, fevers.  Will give Anusol cream, short course of pain medication, laxatives and advised sitz bath and warm compresses to be used to help relieve pain.  Patient appears stable for discharge at this  time.  Strict return precautions given.  narcotic database reviewed with no discrepancies.  Portions of this note were generated with Scientist, clinical (histocompatibility and immunogenetics)Dragon dictation software. Dictation errors may occur despite best attempts at proofreading.   Final Clinical Impressions(s) / ED Diagnoses   Final diagnoses:  External hemorrhoids    ED Discharge Orders        Ordered    hydrocortisone (ANUSOL-HC) 2.5 % rectal cream     07/19/17 1824    polyethylene glycol (MIRALAX / GLYCOLAX) packet  Daily     07/19/17 1824    oxyCODONE-acetaminophen (PERCOCET/ROXICET) 5-325 MG tablet  Every 8 hours PRN     07/19/17 1824         Dietrich PatesKhatri, Cassady Turano, PA-C 07/19/17 1830    Pricilla LovelessGoldston, Scott, MD 07/22/17 [id number]

## 2017-07-19 NOTE — ED Triage Notes (Signed)
Pt states she did a fast and thinks she ate too large of a meal, now has hemorrhoid. Pt states it is very painful. Hx of hemorrhoid in the past.

## 2018-01-04 ENCOUNTER — Encounter: Payer: Self-pay | Admitting: Family Medicine

## 2018-01-04 ENCOUNTER — Ambulatory Visit (INDEPENDENT_AMBULATORY_CARE_PROVIDER_SITE_OTHER): Payer: Medicaid Other | Admitting: Family Medicine

## 2018-01-04 ENCOUNTER — Other Ambulatory Visit (HOSPITAL_COMMUNITY)
Admission: RE | Admit: 2018-01-04 | Discharge: 2018-01-04 | Disposition: A | Discharge status: Home / Self Care | Payer: Medicaid Other | Source: Ambulatory Visit | Attending: Family Medicine | Admitting: Family Medicine

## 2018-01-04 DIAGNOSIS — O34219 Maternal care for unspecified type scar from previous cesarean delivery: Secondary | ICD-10-CM

## 2018-01-04 DIAGNOSIS — Z3482 Encounter for supervision of other normal pregnancy, second trimester: Secondary | ICD-10-CM

## 2018-01-04 DIAGNOSIS — Z3480 Encounter for supervision of other normal pregnancy, unspecified trimester: Secondary | ICD-10-CM | POA: Diagnosis not present

## 2018-01-04 MED ORDER — CONCEPT OB 130-92.4-1 MG PO CAPS
1.0000 | ORAL_CAPSULE | Freq: Every day | ORAL | 3 refills | Status: DC
Start: 2018-01-04 — End: 2018-05-14

## 2018-01-04 NOTE — Patient Instructions (Signed)

## 2018-01-04 NOTE — Progress Notes (Signed)
Subjective:   Melody Williams is a 27 y.o. G2P0102 at [redacted]w[redacted]d by LMP being seen today for her first obstetrical visit.  Her obstetrical history is significant for intrauterine growth restriction (IUGR) and previous C-section. Patient does intend to breast feed. Pregnancy history fully reviewed.  Patient reports no complaints.  HISTORY: OB History  Gravida Para Term Preterm AB Living  2 1 0 1 0 2  SAB TAB Ectopic Multiple Live Births  0 0 0 1 2    # Outcome Date GA Lbr Len/2nd Weight Sex Delivery Anes PTL Lv  2 Current           1A Preterm 08/05/12 [redacted]w[redacted]d  3 lb 13.4 oz (1.741 kg) F CS-LTranv Spinal  LIV     Name: Buffone,GIRLA Keisi     Apgar1: 5  Apgar5: 7  1B Preterm 08/05/12 [redacted]w[redacted]d  4 lb 6.6 oz (2 kg) F CS-LTranv   LIV     Name: Crissman,GIRLB Avey     Apgar1: 7  Apgar5: 8   Past Medical History:  Diagnosis Date  . Acute meniscal tear of knee LEFT  . Anxiety   . Eczema    Past Surgical History:  Procedure Laterality Date  . CESAREAN SECTION N/A 08/05/2012   Procedure: CESAREAN SECTION;  Surgeon: Adam Phenix, MD;  Location: [facility];  Service: Obstetrics;  Laterality: N/A;  Twins  . KNEE ARTHROSCOPY  05/01/2011   Procedure: ARTHROSCOPY KNEE;  Surgeon: Javier Docker;  Location: [facility];  Service: Orthopedics;  Laterality: Left;  left knee arthroscopy with debridement ,removal of loose  bodies   No family history on file. Social History   Tobacco Use  . Smoking status: Never Smoker  . Smokeless tobacco: Never Used  Substance Use Topics  . Alcohol use: No  . Drug use: No   Allergies  Allergen Reactions  . Citrus Rash    Watermelon  . Peanut-Containing Drug Products Itching    MOUTH W/ SEVER ITCHING   No current outpatient medications on file prior to visit.   No current facility-administered medications on file prior to visit.      Exam   Vitals:   01/04/18 1416  BP: 115/77  Pulse: 83  Weight: 157 lb (71.2 kg)      Uterus:    FH 27  Pelvic Exam: Perineum: no hemorrhoids, normal perineum   Vulva: normal external genitalia, no lesions   Vagina:  normal mucosa, normal discharge   Cervix: no lesions and normal, long/closed, pap smear done.    Adnexa: normal adnexa and no mass, fullness, tenderness   Bony Pelvis: average  System: General: well-developed, well-nourished female in no acute distress   Breast:  normal appearance, no masses or tenderness   Skin: normal coloration and turgor, no rashes   Neurologic: oriented, normal, negative, normal mood   Extremities: normal strength, tone, and muscle mass, ROM of all joints is normal   HEENT extraocular movement intact and sclera clear, anicteric   Mouth/Teeth mucous membranes moist, pharynx normal without lesions and dental hygiene good   Neck supple and no masses   Cardiovascular: regular rate and rhythm   Respiratory:  no respiratory distress, normal breath sounds   Abdomen: soft, non-tender; bowel sounds normal; no masses,  no organomegaly     Assessment:   Pregnancy: [id number] Patient Active Problem List   Diagnosis Date Noted  . Supervision of other normal pregnancy, antepartum 01/04/2018  . Previous cesarean delivery affecting  pregnancy, antepartum 01/04/2018  . Depression 01/01/2014     Plan:  1. Supervision of other normal pregnancy, antepartum Labs done NIPT today Schedule anatomy us States they are moving out of town early [employer]-->near [place] - Cervicovaginal ancillary only - Cytology - PAP - Obstetric Panel, Including HIV - Hemoglobinopathy evaluation - Culture, OB Urine - Cystic Fibrosis Mutation 97 - SMN1 Copy Number Analysis - Genetic Screening - US MFM OB COMP + 14 WK; Future - Prenat w/o A Vit-FeFum-FePo-FA (CONCEPT OB) 130-92.4-1 MG CAPS; Take 1 capsule by mouth daily.  Dispense: 180 capsule; Refill: 3  2. Previous cesarean delivery affecting pregnancy, antepartum Desires TOLAC--briefly discussed.  Initial labs  drawn. Continue prenatal vitamins. Genetic Screening discussed, NIPS: ordered. Ultrasound discussed; fetal anatomic survey: ordered. Problem list reviewed and updated.  Routine obstetric precautions reviewed. Return in 2 weeks (on 01/18/2018) for 28 wk labs.

## 2018-01-05 LAB — CYTOLOGY - PAP: DIAGNOSIS: NEGATIVE

## 2018-01-05 LAB — CERVICOVAGINAL ANCILLARY ONLY
Chlamydia: NEGATIVE
NEISSERIA GONORRHEA: NEGATIVE

## 2018-01-06 ENCOUNTER — Other Ambulatory Visit: Payer: Self-pay | Admitting: Family Medicine

## 2018-01-06 ENCOUNTER — Other Ambulatory Visit (HOSPITAL_COMMUNITY): Payer: Self-pay | Admitting: *Deleted

## 2018-01-06 ENCOUNTER — Ambulatory Visit (HOSPITAL_COMMUNITY)
Admission: RE | Admit: 2018-01-06 | Discharge: 2018-01-06 | Disposition: A | Discharge status: Home / Self Care | Payer: Medicaid Other | Source: Ambulatory Visit | Attending: Family Medicine | Admitting: Family Medicine

## 2018-01-06 DIAGNOSIS — O34219 Maternal care for unspecified type scar from previous cesarean delivery: Secondary | ICD-10-CM | POA: Insufficient documentation

## 2018-01-06 DIAGNOSIS — O09292 Supervision of pregnancy with other poor reproductive or obstetric history, second trimester: Secondary | ICD-10-CM | POA: Diagnosis not present

## 2018-01-06 DIAGNOSIS — Z363 Encounter for antenatal screening for malformations: Secondary | ICD-10-CM

## 2018-01-06 DIAGNOSIS — Z8759 Personal history of other complications of pregnancy, childbirth and the puerperium: Secondary | ICD-10-CM

## 2018-01-06 DIAGNOSIS — O0932 Supervision of pregnancy with insufficient antenatal care, second trimester: Secondary | ICD-10-CM | POA: Diagnosis not present

## 2018-01-06 DIAGNOSIS — O09212 Supervision of pregnancy with history of pre-term labor, second trimester: Secondary | ICD-10-CM | POA: Insufficient documentation

## 2018-01-06 DIAGNOSIS — Z3480 Encounter for supervision of other normal pregnancy, unspecified trimester: Secondary | ICD-10-CM

## 2018-01-06 DIAGNOSIS — Z3A22 22 weeks gestation of pregnancy: Secondary | ICD-10-CM | POA: Diagnosis not present

## 2018-01-06 IMAGING — US US MFM OB DETAIL+14 WK
1 series · 13 of 28 positions shown · non-contrast
Comparison: none

[Series 1: us mfm ob detail+14 wk · 86 acquisitions, 13 frames shown]
[im 4/86]
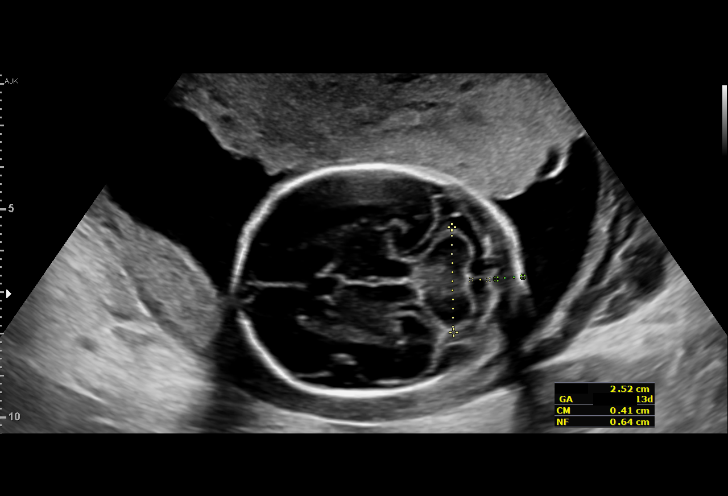
[im 10/86]
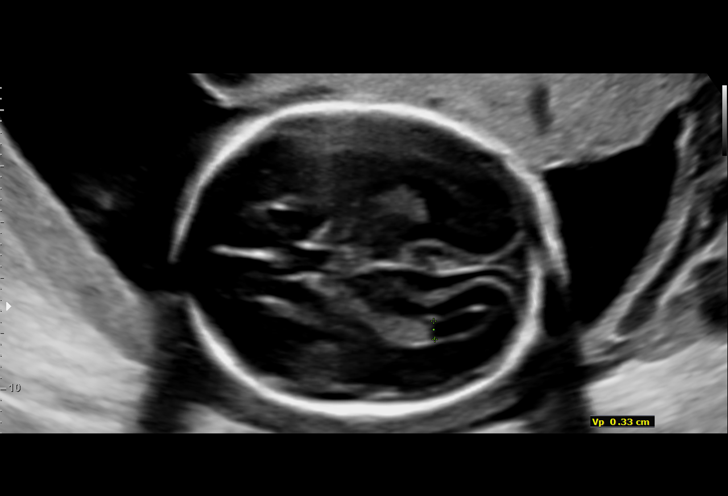
[im 16/86]
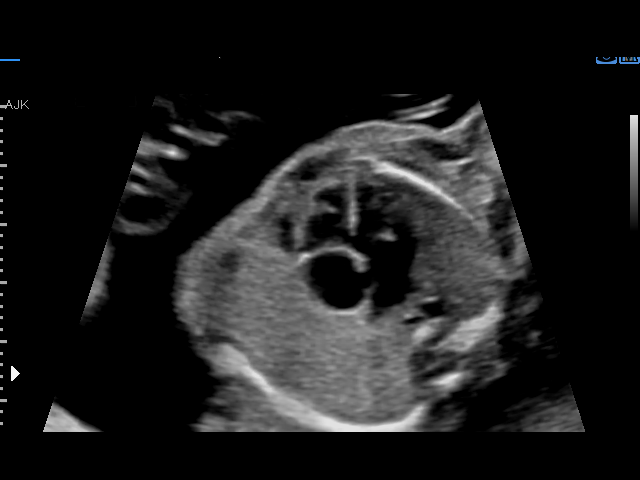
[im 23/86]
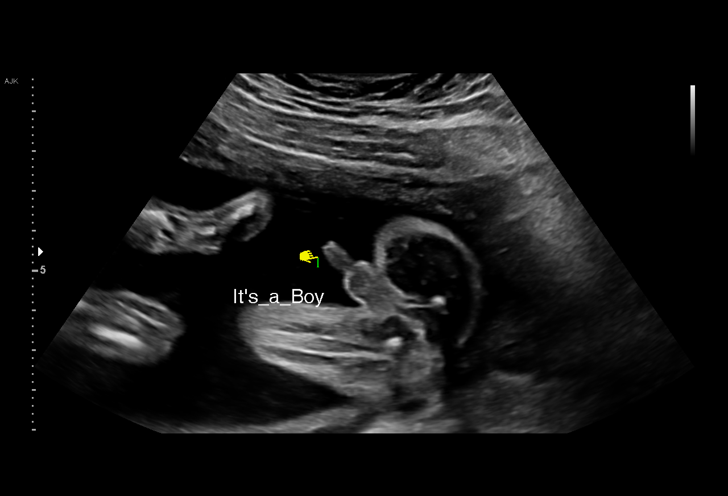
[im 29/86]
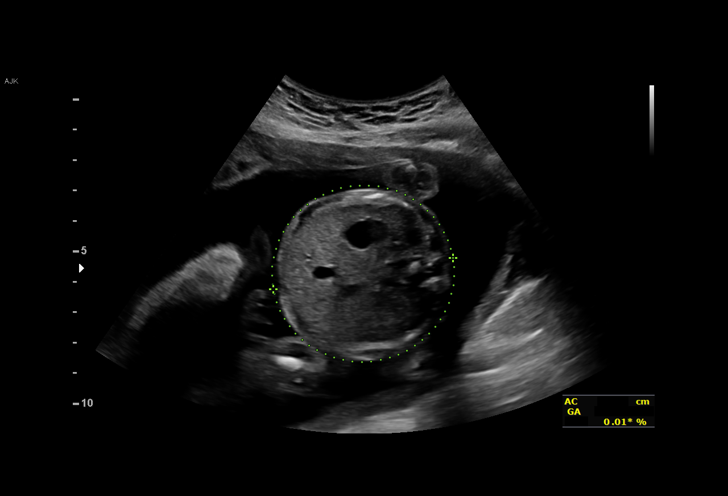
[im 35/86]
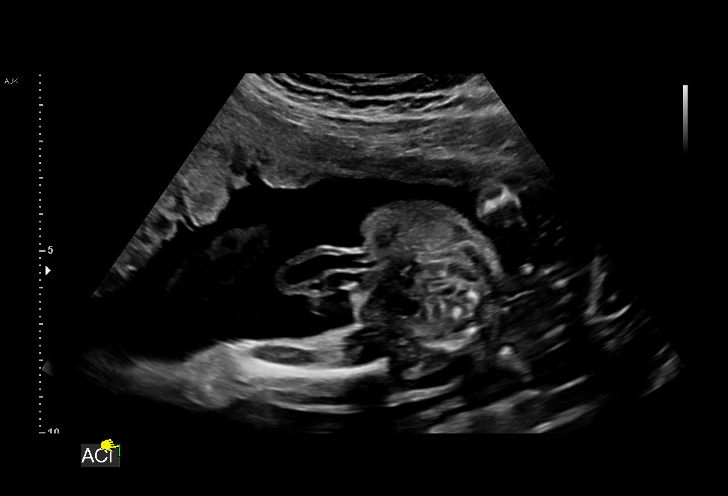
[im 45/86]
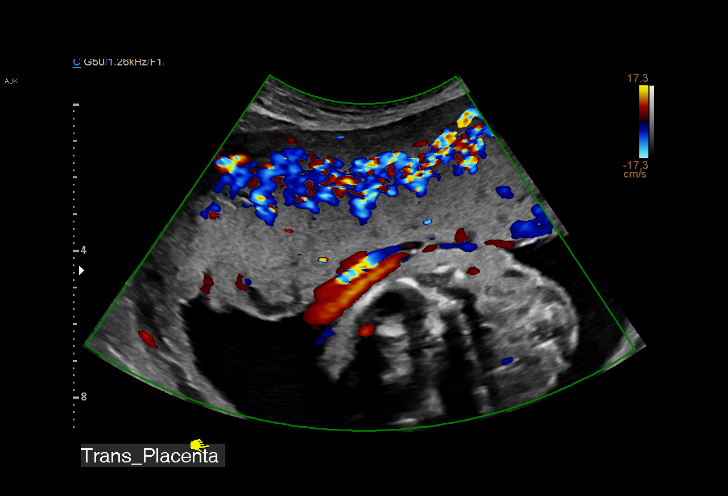
[im 51/86]
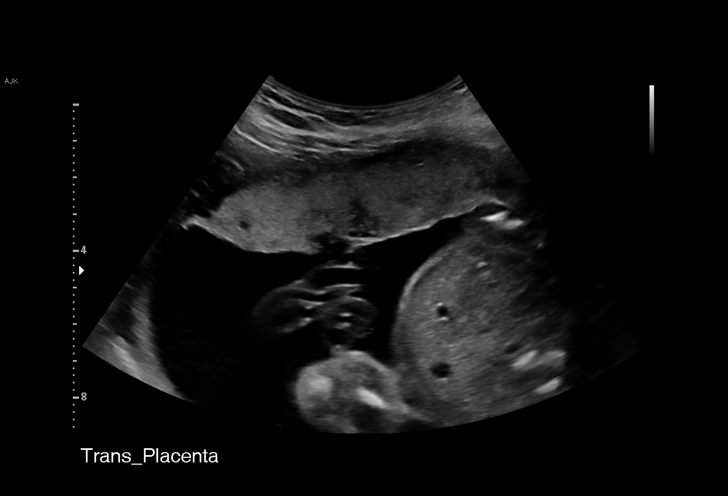
[im 57/86]
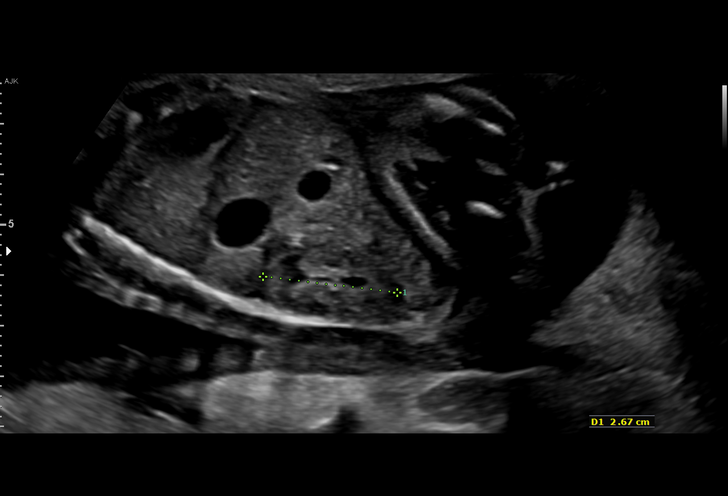
[im 63/86]
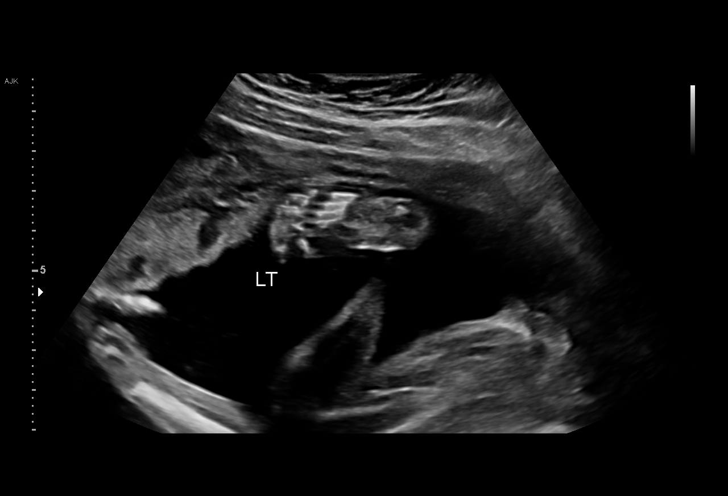
[im 70/86]
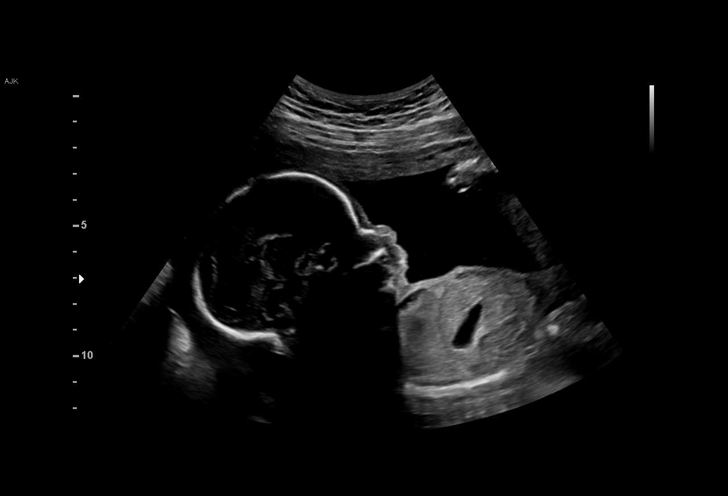
[im 76/86]
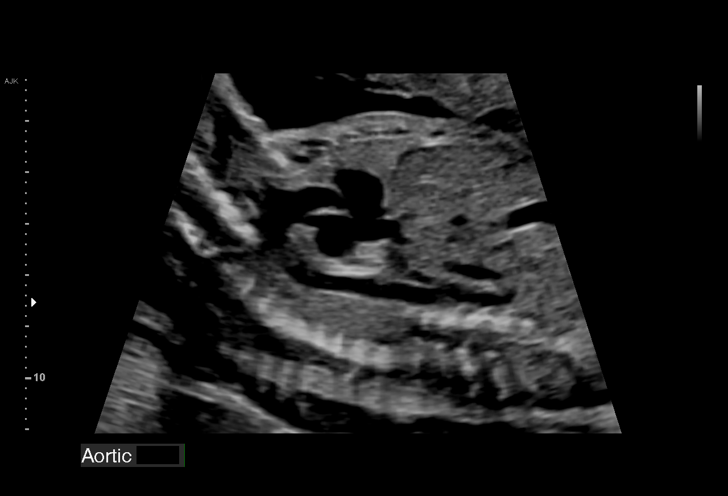
[im 82/86]
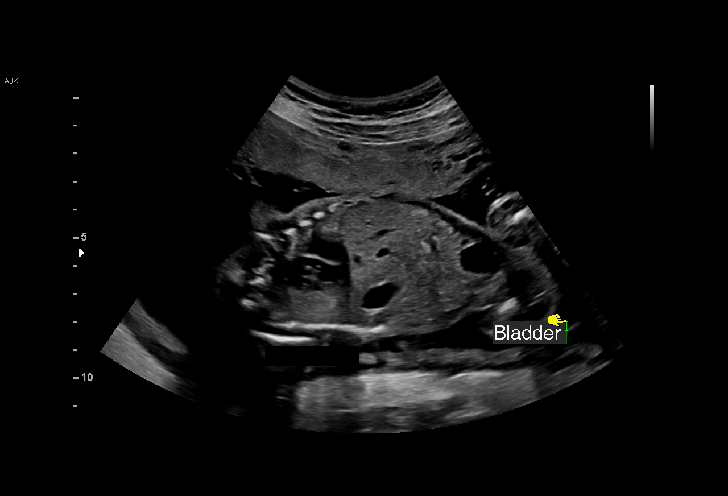

[13 of 28 positions shown; findings below may reference images not displayed]

Indications

Encounter for antenatal screening for
malformations
Poor obstetric history: Previous preterm
delivery, antepartum (65w0d, twin pregnancy)
Previous cesarean delivery, antepartum
Poor obstetric history: Previous fetal growth
restriction (FGR)
Late prenatal care, second trimester
22 weeks gestation of pregnancy
Fetal Evaluation

Num Of Fetuses:         1
Fetal Heart Rate(bpm):  159
Cardiac Activity:       Observed
Presentation:           Variable
Placenta:               Anterior
P. Cord Insertion:      Visualized

Amniotic Fluid
AFI FV:      Within normal limits
Biometry

BPD:      54.4  mm     G. Age:  22w 4d         40  %    CI:        71.78   %    70 - 86
FL/HC:      19.0   %    19.2 -
HC:      204.4  mm     G. Age:  22w 4d         30  %    HC/AC:      1.12        1.05 -
AC:      183.3  mm     G. Age:  23w 1d         56  %    FL/BPD:     71.3   %    71 - 87
FL:       38.8  mm     G. Age:  22w 3d         31  %    FL/AC:      21.2   %    20 - 24
HUM:      36.7  mm     G. Age:  22w 6d         46  %
CER:      25.2  mm     G. Age:  23w 1d         57  %
NFT:       6.4  mm
LV:        3.3  mm
CM:        4.1  mm

Est. FW:     536  gm      1 lb 3 oz     53  %
OB History

Gravidity:    2         Prem:   1         SAB:   0
TOP:          0       Ectopic:  0        Living: 2
Gestational Age

LMP:           28w 0d        Date:  06/24/17                 EDD:   03/31/18
U/S Today:     22w 5d                                        EDD:   05/07/18
Best:          22w 5d     Det. By:  U/S (01/06/18)           EDD:   05/07/18
Anatomy

Cranium:               Appears normal         LVOT:                   Appears normal
Cavum:                 Appears normal         Aortic Arch:            Appears normal
Ventricles:            Appears normal         Ductal Arch:            limited views nml
Choroid Plexus:        Appears normal         Diaphragm:              Appears normal
Cerebellum:            Appears normal         Stomach:                Appears normal, left
sided
Posterior Fossa:       Appears normal         Abdomen:                Appears normal
Nuchal Fold:           Not applicable (>20    Abdominal Wall:         Appears nml (cord
wks GA)                                        insert, abd wall)
Face:                  Appears normal         Cord Vessels:           Appears normal (3
(orbits and profile)                           vessel cord)
Lips:                  Appears normal         Kidneys:                Appear normal
Palate:                Appears normal         Bladder:                Appears normal
Thoracic:              Appears normal         Spine:                  Not well visualized
Heart:                 Appears normal         Upper Extremities:      Appears normal
(4CH, axis, and
situs)
RVOT:                  Appears normal         Lower Extremities:      Appears normal

Other:  Fetus appears to be a male.RT heel visualized. Technically difficult
due to fetal position.
Cervix Uterus Adnexa

Cervix
Length:           4.59  cm.
Normal appearance by transabdominal scan.

Uterus
No abnormality visualized.

Left Ovary
Within normal limits.

Right Ovary
Within normal limits.

Cul De Sac
No free fluid seen.

Adnexa
No abnormality visualized.
Comments

U/S images reviewed. Findings reviewed with patient.   Fetal
growth is c/w a 22 week gestation.  Patient should be 28
weeks by LMP.  She reports having a blood test on [REDACTED]
that said she was 5 weeks pregnant which would be c/w
today's U/S.  No fetal abnormalities are seen.
Questions answered.
10 minutes spent face to face.
Recommendations: 1) Follow-up growth in 3 weeks for
completion of anatomy and to evaluate rate of growth.
Recommendations

1) Follow-up growth in 3 weeks for completion of anatomy
and to evaluate rate of growth.

## 2018-01-07 LAB — URINE CULTURE, OB REFLEX

## 2018-01-07 LAB — CULTURE, OB URINE

## 2018-01-17 ENCOUNTER — Encounter: Payer: Self-pay | Admitting: Obstetrics & Gynecology

## 2018-01-17 DIAGNOSIS — Z8759 Personal history of other complications of pregnancy, childbirth and the puerperium: Secondary | ICD-10-CM | POA: Insufficient documentation

## 2018-01-17 LAB — SMN1 COPY NUMBER ANALYSIS (SMA CARRIER SCREENING)

## 2018-01-17 LAB — OBSTETRIC PANEL, INCLUDING HIV
Antibody Screen: NEGATIVE
Basophils Absolute: 0 10*3/uL (ref 0.0–0.2)
Basos: 0 %
EOS (ABSOLUTE): 0.1 10*3/uL (ref 0.0–0.4)
Eos: 1 %
HEMATOCRIT: 34.8 % (ref 34.0–46.6)
HEP B S AG: NEGATIVE
HIV SCREEN 4TH GENERATION: NONREACTIVE
Hemoglobin: 11.5 g/dL (ref 11.1–15.9)
IMMATURE GRANULOCYTES: 2 %
Immature Grans (Abs): 0.2 10*3/uL — ABNORMAL HIGH (ref 0.0–0.1)
LYMPHS ABS: 2.2 10*3/uL (ref 0.7–3.1)
Lymphs: 17 %
MCH: 31.5 pg (ref 26.6–33.0)
MCHC: 33 g/dL (ref 31.5–35.7)
MCV: 95 fL (ref 79–97)
Monocytes Absolute: 0.8 10*3/uL (ref 0.1–0.9)
Monocytes: 6 %
Neutrophils Absolute: 9.5 10*3/uL — ABNORMAL HIGH (ref 1.4–7.0)
Neutrophils: 74 %
Platelets: 271 10*3/uL (ref 150–450)
RBC: 3.65 x10E6/uL — ABNORMAL LOW (ref 3.77–5.28)
RDW: 12.6 % (ref 12.3–15.4)
RPR: NONREACTIVE
RUBELLA: 1.6 {index} (ref >0.99)
Rh Factor: POSITIVE
WBC: 12.8 10*3/uL — AB (ref 3.4–10.8)

## 2018-01-17 LAB — CYSTIC FIBROSIS MUTATION 97: Interpretation: NOT DETECTED

## 2018-01-17 LAB — HEMOGLOBINOPATHY EVALUATION
HEMOGLOBIN A2 QUANTITATION: 2.8 % (ref 1.8–3.2)
HGB C: 0 %
HGB S: 0 %
HGB VARIANT: 0 %
Hemoglobin F Quantitation: 0 % (ref 0.0–2.0)
Hgb A: 97.2 % (ref 96.4–98.8)

## 2018-01-19 ENCOUNTER — Encounter: Payer: Medicaid Other | Admitting: Obstetrics & Gynecology

## 2018-01-27 ENCOUNTER — Ambulatory Visit (HOSPITAL_COMMUNITY): Payer: Medicaid Other

## 2018-02-01 ENCOUNTER — Ambulatory Visit (HOSPITAL_COMMUNITY): Payer: Medicaid Other

## 2018-02-03 ENCOUNTER — Encounter: Payer: Medicaid Other | Admitting: Certified Nurse Midwife

## 2018-02-04 ENCOUNTER — Ambulatory Visit (HOSPITAL_COMMUNITY)
Admission: RE | Admit: 2018-02-04 | Discharge: 2018-02-04 | Disposition: A | Discharge status: Home / Self Care | Payer: Medicaid Other | Source: Ambulatory Visit | Attending: Family Medicine | Admitting: Family Medicine

## 2018-02-04 DIAGNOSIS — O34219 Maternal care for unspecified type scar from previous cesarean delivery: Secondary | ICD-10-CM | POA: Insufficient documentation

## 2018-02-04 DIAGNOSIS — O09212 Supervision of pregnancy with history of pre-term labor, second trimester: Secondary | ICD-10-CM

## 2018-02-04 DIAGNOSIS — Z363 Encounter for antenatal screening for malformations: Secondary | ICD-10-CM | POA: Insufficient documentation

## 2018-02-04 DIAGNOSIS — Z3A26 26 weeks gestation of pregnancy: Secondary | ICD-10-CM | POA: Diagnosis not present

## 2018-02-04 DIAGNOSIS — O0932 Supervision of pregnancy with insufficient antenatal care, second trimester: Secondary | ICD-10-CM | POA: Insufficient documentation

## 2018-02-04 DIAGNOSIS — O09292 Supervision of pregnancy with other poor reproductive or obstetric history, second trimester: Secondary | ICD-10-CM

## 2018-02-04 DIAGNOSIS — Z362 Encounter for other antenatal screening follow-up: Secondary | ICD-10-CM

## 2018-02-04 DIAGNOSIS — Z3687 Encounter for antenatal screening for uncertain dates: Secondary | ICD-10-CM | POA: Diagnosis present

## 2018-02-04 IMAGING — US US MFM OB FOLLOW-UP
1 series · 13 of 28 positions shown · non-contrast
Comparison: none

[Series 1: us mfm ob follow-up · 54 acquisitions, 13 frames shown]
[im 2/54]
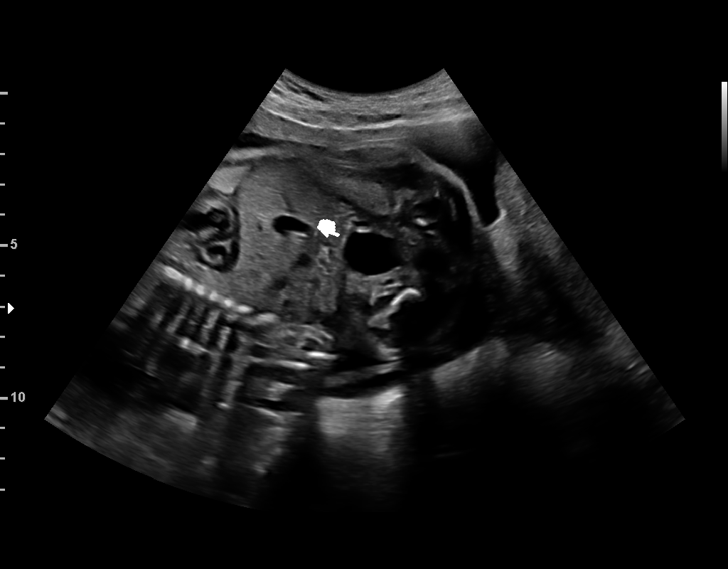
[im 6/54]
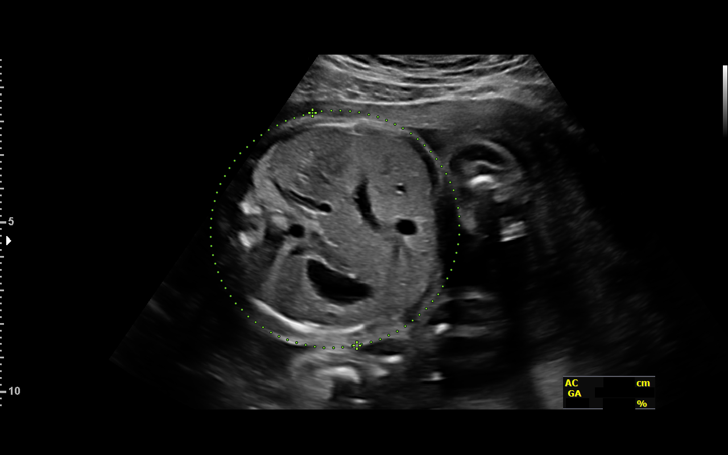
[im 10/54]
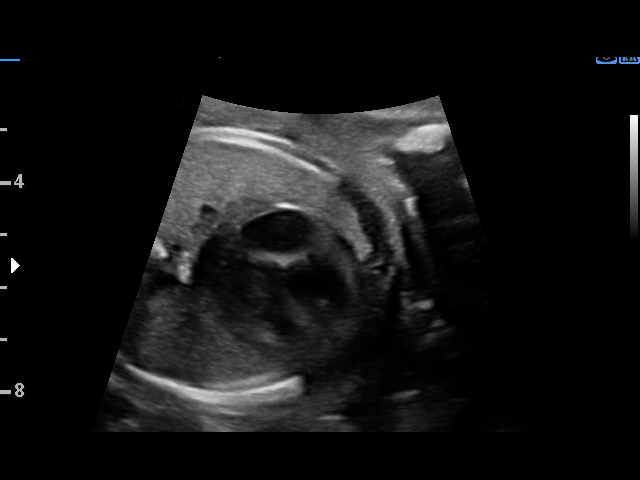
[im 14/54]
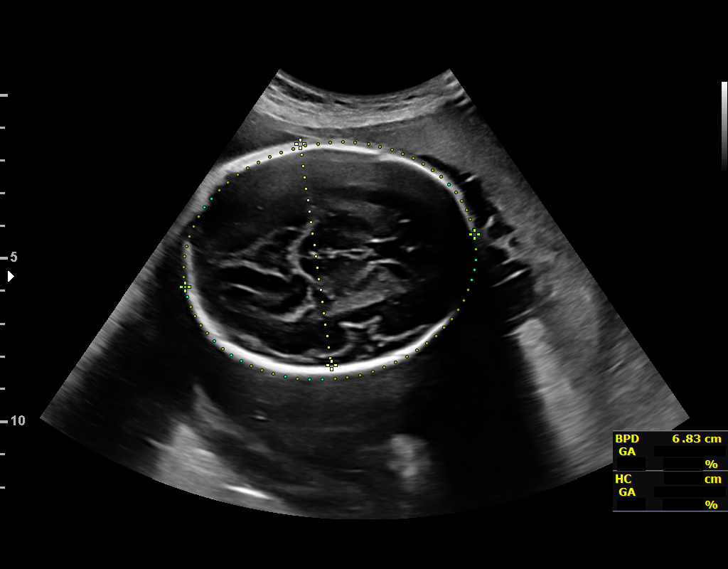
[im 18/54]
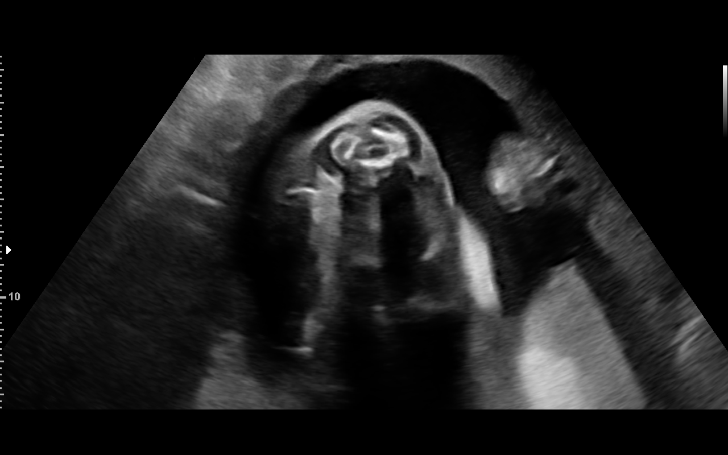
[im 22/54]
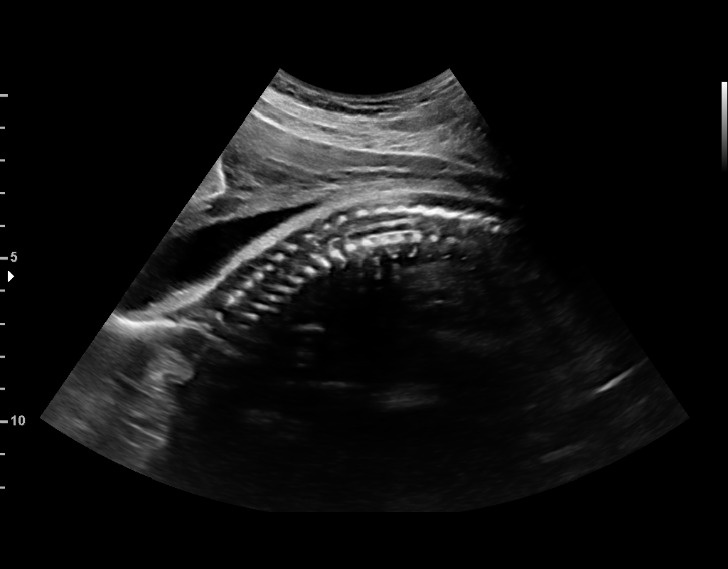
[im 28/54]
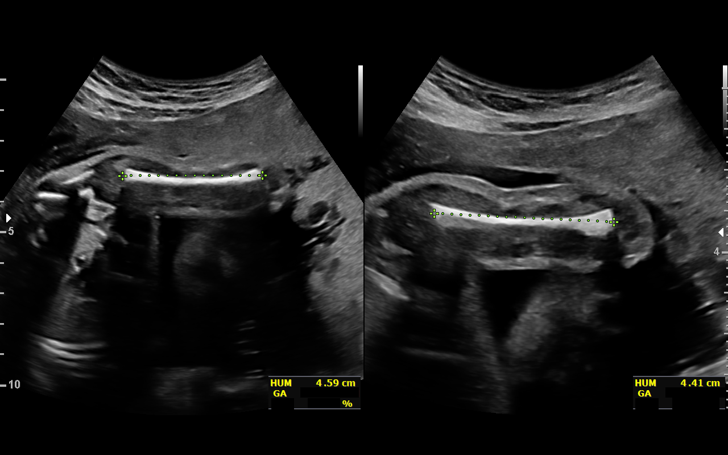
[im 32/54]
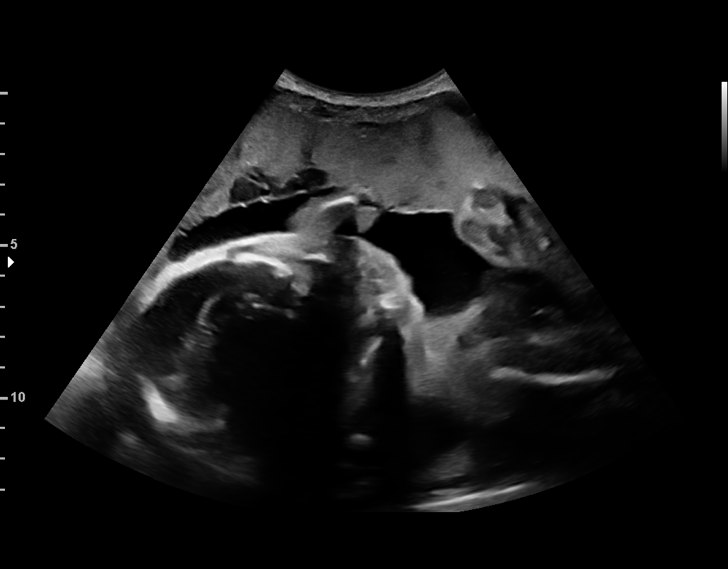
[im 36/54]
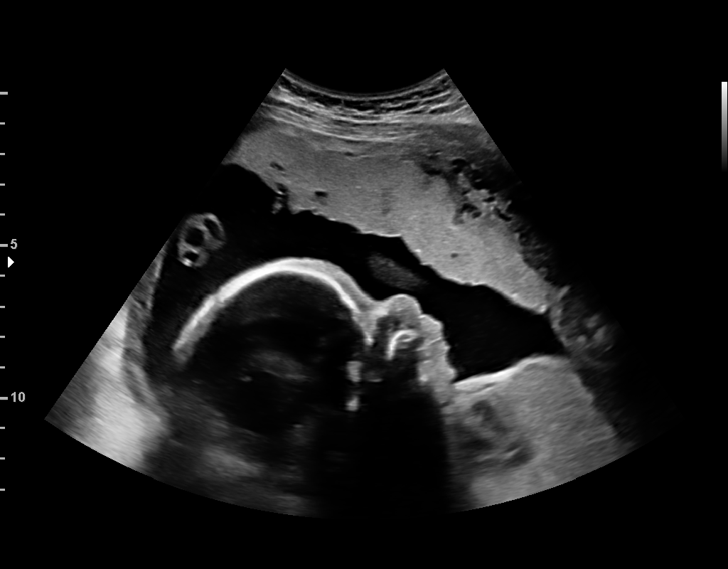
[im 40/54]
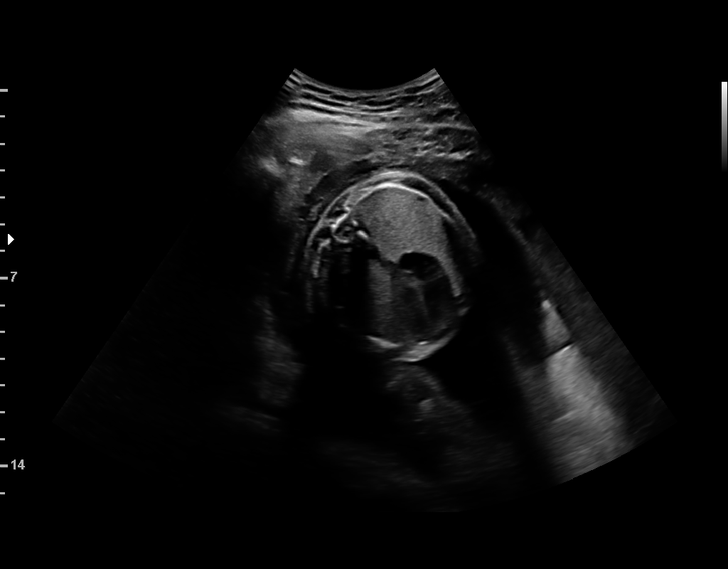
[im 44/54]
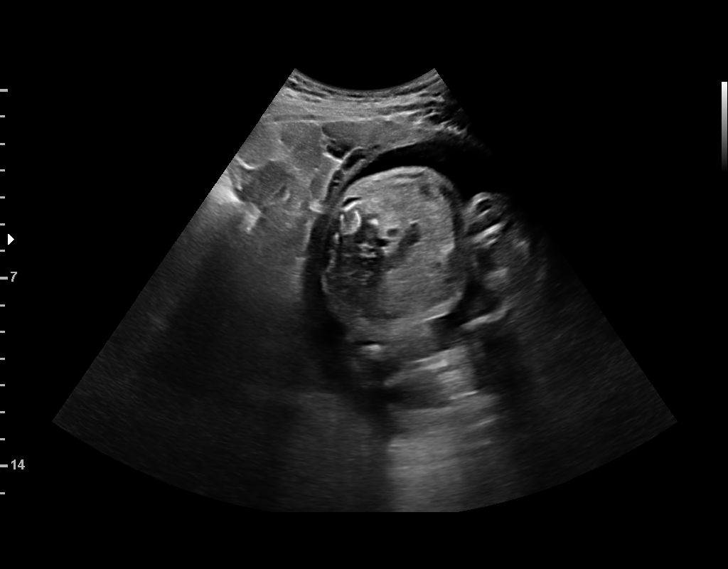
[im 48/54]
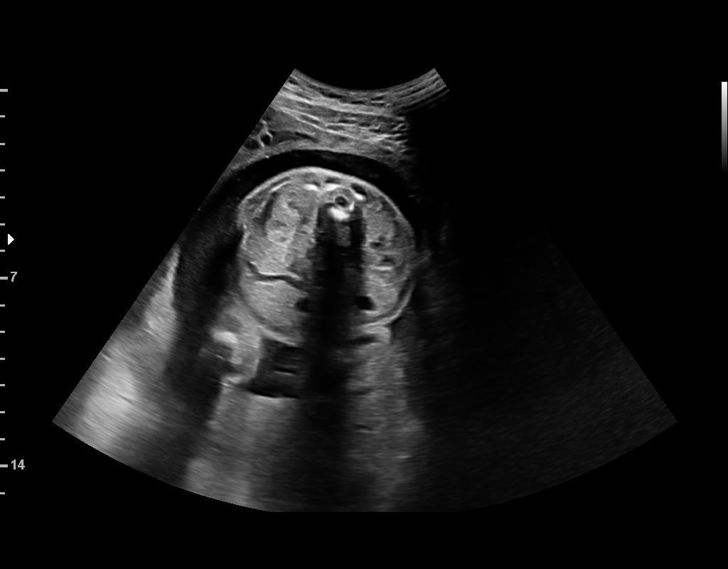
[im 52/54]
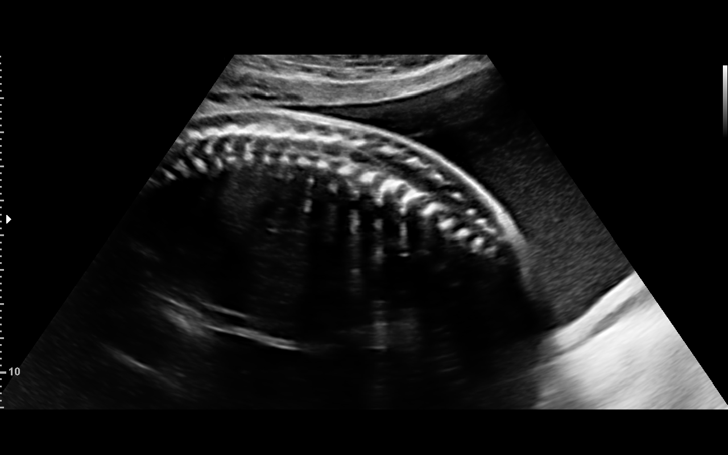

[13 of 28 positions shown; findings below may reference images not displayed]

Indications

Encounter for other antenatal screening
follow-up
26 weeks gestation of pregnancy
Poor obstetric history: Previous preterm
delivery, antepartum (63w6d, twin
pregnancy)
Previous cesarean delivery, antepartum
Poor obstetric history: Previous fetal growth
restriction (FGR)
Late prenatal care, second trimester
Fetal Evaluation

Num Of Fetuses:          1
Fetal Heart              148
Rate(bpm):
Cardiac Activity:        Observed
Presentation:            Breech
Placenta:                Anterior
P. Cord Insertion:       Previously Visualized

Amniotic Fluid
AFI FV:      Within normal limits

Largest Pocket(cm)
5.97
Biometry

BPD:      68.1  mm     G. Age:  27w 3d         58  %    CI:          72.1  %    70 - 86
FL/HC:       18.8  %    18.6 -
HC:      255.2  mm     G. Age:  27w 5d         52  %    HC/AC:       1.16       1.05 -
AC:      219.6  mm     G. Age:  26w 3d         28  %    FL/BPD:      70.3  %    71 - 87
FL:       47.9  mm     G. Age:  26w 0d         15  %    FL/AC:       21.8  %    20 - 24
HUM:      45.5  mm     G. Age:  26w 6d         48  %

LV:        9.1  mm

Est. FW:     939   g      2 lb 1 oz     45  %
m
OB History

Gravidity:    2         Prem:   1         SAB:   0
TOP:          0       Ectopic:  0        Living: 2
Gestational Age

LMP:           32w 1d        Date:  06/24/17                 EDD:    03/31/18
U/S Today:     26w 6d                                        EDD:    05/07/18
Best:          26w 6d     Det. By:  U/S  (01/06/18)          EDD:    05/07/18
Anatomy

Cranium:               Appears normal         LVOT:                   Previously seen
Cavum:                 Previously seen        Aortic Arch:            Previously seen
Ventricles:            Appears normal         Ductal Arch:            Appears normal
Choroid Plexus:        Previously seen        Diaphragm:              Previously seen
Cerebellum:            Previously seen        Stomach:                Appears normal,
left sided
Posterior Fossa:       Previously seen        Abdomen:                Previously seen
Nuchal Fold:           Not applicable (>20    Abdominal Wall:         Previously seen
wks GA)
Face:                  Orbits and profile     Cord Vessels:           Previously seen
previously seen
Lips:                  Previously seen        Kidneys:                Appear normal
Palate:                Previously seen        Bladder:                Appears normal
Thoracic:              Appears normal         Spine:                  Appears normal
Heart:                 Appears normal         Upper Extremities:      Previously seen
(4CH, axis, and
situs)
RVOT:                  Previously seen        Lower Extremities:      Previously seen

Other:  Fetus appears to be a male prev seen.  RT heel prev visualized.
Technically difficult due to fetal position.
Cervix Uterus Adnexa

Cervix
Not visualized (advanced GA >90wks)
Impression

Normal interval growth.  No ultrasonic evidence of structural
fetal anomalies.
Recommendations

Follow up as clinically indicated

## 2018-02-08 ENCOUNTER — Encounter: Payer: Medicaid Other | Admitting: Obstetrics & Gynecology

## 2018-02-24 ENCOUNTER — Encounter: Payer: Self-pay | Admitting: Obstetrics

## 2018-02-24 ENCOUNTER — Other Ambulatory Visit: Payer: Self-pay

## 2018-02-24 ENCOUNTER — Ambulatory Visit (INDEPENDENT_AMBULATORY_CARE_PROVIDER_SITE_OTHER): Payer: Medicaid Other | Admitting: Obstetrics

## 2018-02-24 VITALS — BP 112/70 | HR 83 | Wt 173.7 lb

## 2018-02-24 DIAGNOSIS — G8929 Other chronic pain: Secondary | ICD-10-CM

## 2018-02-24 DIAGNOSIS — M545 Low back pain, unspecified: Secondary | ICD-10-CM

## 2018-02-24 DIAGNOSIS — Z3480 Encounter for supervision of other normal pregnancy, unspecified trimester: Secondary | ICD-10-CM

## 2018-02-24 DIAGNOSIS — G5601 Carpal tunnel syndrome, right upper limb: Secondary | ICD-10-CM

## 2018-02-24 DIAGNOSIS — Z3482 Encounter for supervision of other normal pregnancy, second trimester: Secondary | ICD-10-CM

## 2018-02-24 MED ORDER — COMFORT FIT MATERNITY SUPP SM MISC: 0 refills | Status: DC

## 2018-02-24 NOTE — Progress Notes (Signed)
29.5  Weeks ROB has not been here since her NOB visit on 01/04/18.  Declined FLU & TDAP.  C/o numbness, tingling and pain 8/10 in both hands x 3-4 months.

## 2018-02-24 NOTE — Progress Notes (Signed)
Subjective:  Rosealynn Mateus is a 27 y.o. G2P0102 at [redacted]w[redacted]d being seen today for ongoing prenatal care.  She is currently monitored for the following issues for this low-risk pregnancy and has Supervision of other normal pregnancy, antepartum; Depression; Previous cesarean delivery affecting pregnancy, antepartum; and History of prior pregnancy with IUGR newborn on their problem list.  Patient reports backache and carpal tunnel symptoms.  Contractions: Irritability. Vag. Bleeding: None.  Movement: Present. Denies leaking of fluid.   The following portions of the patient's history were reviewed and updated as appropriate: allergies, current medications, past family history, past medical history, past social history, past surgical history and problem list. Problem list updated.  Objective:   Vitals:   02/24/18 1413  BP: 112/70  Pulse: 83  Weight: 173 lb 11.2 oz (78.8 kg)    Fetal Status:     Movement: Present     General:  Alert, oriented and cooperative. Patient is in no acute distress.  Skin: Skin is warm and dry. No rash noted.   Cardiovascular: Normal heart rate noted  Respiratory: Normal respiratory effort, no problems with respiration noted  Abdomen: Soft, gravid, appropriate for gestational age. Pain/Pressure: Present     Pelvic:  Cervical exam deferred        Extremities: Normal range of motion.  Edema: Trace  Mental Status: Normal mood and affect. Normal behavior. Normal judgment and thought content.   Urinalysis:      Assessment and Plan:  Pregnancy: G2P0102 at [redacted]w[redacted]d  1. Supervision of other normal pregnancy, antepartum  2. Carpal tunnel syndrome of right wrist - wrist splint Rx  3. Chronic midline low back pain without sciatica Rx: - Elastic Bandages & Supports (COMFORT FIT MATERNITY SUPP SM) MISC; Wear as directed.  Dispense: 1 each; Refill: 0  Preterm labor symptoms and general obstetric precautions including but not limited to vaginal bleeding, contractions, leaking  of fluid and fetal movement were reviewed in detail with the patient. Please refer to After Visit Summary for other counseling recommendations.  Return in about 2 weeks (around 03/10/2018) for ROB.   Brock Bad, MD

## 2018-02-25 ENCOUNTER — Other Ambulatory Visit: Payer: Medicaid Other

## 2018-02-25 DIAGNOSIS — Z3480 Encounter for supervision of other normal pregnancy, unspecified trimester: Secondary | ICD-10-CM

## 2018-02-26 LAB — GLUCOSE TOLERANCE, 2 HOURS W/ 1HR
Glucose, 1 hour: 82 mg/dL (ref 65–179)
Glucose, 2 hour: 59 mg/dL — ABNORMAL LOW (ref 65–152)
Glucose, Fasting: 70 mg/dL (ref 65–91)

## 2018-02-26 LAB — CBC
HEMATOCRIT: 35.4 % (ref 34.0–46.6)
Hemoglobin: 12 g/dL (ref 11.1–15.9)
MCH: 32.1 pg (ref 26.6–33.0)
MCHC: 33.9 g/dL (ref 31.5–35.7)
MCV: 95 fL (ref 79–97)
PLATELETS: 245 10*3/uL (ref 150–450)
RBC: 3.74 x10E6/uL — ABNORMAL LOW (ref 3.77–5.28)
RDW: 12.6 % (ref 12.3–15.4)
WBC: 10.5 10*3/uL (ref 3.4–10.8)

## 2018-02-26 LAB — HIV ANTIBODY (ROUTINE TESTING W REFLEX): HIV SCREEN 4TH GENERATION: NONREACTIVE

## 2018-02-26 LAB — RPR: RPR Ser Ql: NONREACTIVE

## 2018-03-10 ENCOUNTER — Encounter: Payer: Medicaid Other | Admitting: Obstetrics

## 2018-03-18 ENCOUNTER — Ambulatory Visit (INDEPENDENT_AMBULATORY_CARE_PROVIDER_SITE_OTHER): Payer: Medicaid Other | Admitting: Obstetrics

## 2018-03-18 ENCOUNTER — Encounter: Payer: Self-pay | Admitting: Obstetrics

## 2018-03-18 VITALS — BP 120/81 | HR 94 | Wt 177.4 lb

## 2018-03-18 DIAGNOSIS — Z3483 Encounter for supervision of other normal pregnancy, third trimester: Secondary | ICD-10-CM

## 2018-03-18 DIAGNOSIS — G5601 Carpal tunnel syndrome, right upper limb: Secondary | ICD-10-CM

## 2018-03-18 DIAGNOSIS — G8929 Other chronic pain: Secondary | ICD-10-CM

## 2018-03-18 DIAGNOSIS — Z3480 Encounter for supervision of other normal pregnancy, unspecified trimester: Secondary | ICD-10-CM

## 2018-03-18 DIAGNOSIS — O34219 Maternal care for unspecified type scar from previous cesarean delivery: Secondary | ICD-10-CM

## 2018-03-18 DIAGNOSIS — M545 Low back pain: Secondary | ICD-10-CM

## 2018-03-18 NOTE — Progress Notes (Signed)
Pt is here for ROB. G2P2 [redacted]w[redacted]d.

## 2018-03-18 NOTE — Progress Notes (Signed)
Subjective:  Melody Williams is a 27 y.o. G2P0102 at [redacted]w[redacted]d being seen today for ongoing prenatal care.  She is currently monitored for the following issues for this low-risk pregnancy and has Supervision of other normal pregnancy, antepartum; Depression; Previous cesarean delivery affecting pregnancy, antepartum; and History of prior pregnancy with IUGR newborn on their problem list.  Patient reports no complaints.  Contractions: Irritability. Vag. Bleeding: None.  Movement: Present. Denies leaking of fluid.   The following portions of the patient's history were reviewed and updated as appropriate: allergies, current medications, past family history, past medical history, past social history, past surgical history and problem list. Problem list updated.  Objective:   Vitals:   03/18/18 1026  BP: 120/81  Pulse: 94  Weight: 177 lb 6.4 oz (80.5 kg)    Fetal Status: Fetal Heart Rate (bpm): 150   Movement: Present     General:  Alert, oriented and cooperative. Patient is in no acute distress.  Skin: Skin is warm and dry. No rash noted.   Cardiovascular: Normal heart rate noted  Respiratory: Normal respiratory effort, no problems with respiration noted  Abdomen: Soft, gravid, appropriate for gestational age. Pain/Pressure: Present     Pelvic:  Cervical exam deferred        Extremities: Normal range of motion.  Edema: Trace  Mental Status: Normal mood and affect. Normal behavior. Normal judgment and thought content.   Urinalysis:      Assessment and Plan:  Pregnancy: G2P0102 at [redacted]w[redacted]d  1. Supervision of other normal pregnancy, antepartum  2. Carpal tunnel syndrome of right wrist - wrist splints Rx  3. Chronic midline low back pain without sciatica - maternity belt Rx  4. Previous cesarean delivery affecting pregnancy, antepartum   Preterm labor symptoms and general obstetric precautions including but not limited to vaginal bleeding, contractions, leaking of fluid and fetal movement  were reviewed in detail with the patient. Please refer to After Visit Summary for other counseling recommendations.  Return in about 2 weeks (around 04/01/2018) for ROB.   Brock Bad, MD

## 2018-03-31 ENCOUNTER — Ambulatory Visit (INDEPENDENT_AMBULATORY_CARE_PROVIDER_SITE_OTHER): Payer: Medicaid Other | Admitting: Obstetrics

## 2018-03-31 ENCOUNTER — Encounter: Payer: Self-pay | Admitting: Obstetrics

## 2018-03-31 VITALS — BP 109/72 | HR 78 | Wt 182.7 lb

## 2018-03-31 DIAGNOSIS — O34219 Maternal care for unspecified type scar from previous cesarean delivery: Secondary | ICD-10-CM

## 2018-03-31 DIAGNOSIS — Z3483 Encounter for supervision of other normal pregnancy, third trimester: Secondary | ICD-10-CM

## 2018-03-31 DIAGNOSIS — Z3480 Encounter for supervision of other normal pregnancy, unspecified trimester: Secondary | ICD-10-CM

## 2018-03-31 NOTE — Progress Notes (Signed)
Subjective:  Melody Williams is a 27 y.o. G2P0102 at 4952w5d being seen today for ongoing prenatal care.  She is currently monitored for the following issues for this low-risk pregnancy and has Supervision of other normal pregnancy, antepartum; Depression; Previous cesarean delivery affecting pregnancy, antepartum; and History of prior pregnancy with IUGR newborn on their problem list.  Patient reports backache.  Contractions: Irritability. Vag. Bleeding: None.  Movement: Present. Denies leaking of fluid.   The following portions of the patient's history were reviewed and updated as appropriate: allergies, current medications, past family history, past medical history, past social history, past surgical history and problem list. Problem list updated.  Objective:   Vitals:   03/31/18 1006  BP: 109/72  Pulse: 78  Weight: 182 lb 11.2 oz (82.9 kg)    Fetal Status: Fetal Heart Rate (bpm): 150   Movement: Present     General:  Alert, oriented and cooperative. Patient is in no acute distress.  Skin: Skin is warm and dry. No rash noted.   Cardiovascular: Normal heart rate noted  Respiratory: Normal respiratory effort, no problems with respiration noted  Abdomen: Soft, gravid, appropriate for gestational age. Pain/Pressure: Present     Pelvic:  Cervical exam deferred        Extremities: Normal range of motion.  Edema: Moderate pitting, indentation subsides rapidly  Mental Status: Normal mood and affect. Normal behavior. Normal judgment and thought content.   Urinalysis:      Assessment and Plan:  Pregnancy: G2P0102 at 3752w5d  1. Supervision of other normal pregnancy, antepartum  2. Previous cesarean delivery affecting pregnancy, antepartum   Preterm labor symptoms and general obstetric precautions including but not limited to vaginal bleeding, contractions, leaking of fluid and fetal movement were reviewed in detail with the patient. Please refer to After Visit Summary for other counseling  recommendations.  Return in about 1 week (around 04/07/2018) for ROB.   Brock BadHarper, Charles A, MD

## 2018-04-11 ENCOUNTER — Encounter: Payer: Medicaid Other | Admitting: Obstetrics

## 2018-04-14 ENCOUNTER — Ambulatory Visit (INDEPENDENT_AMBULATORY_CARE_PROVIDER_SITE_OTHER): Payer: Medicaid Other | Admitting: Obstetrics

## 2018-04-14 ENCOUNTER — Encounter: Payer: Self-pay | Admitting: Obstetrics

## 2018-04-14 ENCOUNTER — Other Ambulatory Visit (HOSPITAL_COMMUNITY)
Admission: RE | Admit: 2018-04-14 | Discharge: 2018-04-14 | Disposition: A | Discharge status: Home / Self Care | Payer: Medicaid Other | Source: Ambulatory Visit | Attending: Obstetrics | Admitting: Obstetrics

## 2018-04-14 VITALS — BP 115/72 | HR 80 | Wt 183.0 lb

## 2018-04-14 DIAGNOSIS — Z3480 Encounter for supervision of other normal pregnancy, unspecified trimester: Secondary | ICD-10-CM | POA: Diagnosis not present

## 2018-04-14 DIAGNOSIS — Z3483 Encounter for supervision of other normal pregnancy, third trimester: Secondary | ICD-10-CM

## 2018-04-14 DIAGNOSIS — O34219 Maternal care for unspecified type scar from previous cesarean delivery: Secondary | ICD-10-CM

## 2018-04-14 NOTE — Progress Notes (Signed)
CC: Carpel tunnel

## 2018-04-14 NOTE — Progress Notes (Signed)
Subjective:  Melody Williams is a 27 y.o. G2P0102 at 1244w5d being seen today for ongoing prenatal care.  Melody Williams is currently monitored for the following issues for this low-risk pregnancy and has Supervision of other normal pregnancy, antepartum; Depression; Previous cesarean delivery affecting pregnancy, antepartum; and History of prior pregnancy with IUGR newborn on their problem list.  Patient reports no complaints.  Contractions: Irritability. Vag. Bleeding: None.  Movement: Present. Denies leaking of fluid.   The following portions of the patient's history were reviewed and updated as appropriate: allergies, current medications, past family history, past medical history, past social history, past surgical history and problem list. Problem list updated.  Objective:   Vitals:   04/14/18 0952  BP: 115/72  Pulse: 80  Weight: 183 lb (83 kg)    Fetal Status: Fetal Heart Rate (bpm): 150   Movement: Present     General:  Alert, oriented and cooperative. Patient is in no acute distress.  Skin: Skin is warm and dry. No rash noted.   Cardiovascular: Normal heart rate noted  Respiratory: Normal respiratory effort, no problems with respiration noted  Abdomen: Soft, gravid, appropriate for gestational age. Pain/Pressure: Present     Pelvic:  Cervical exam deferred        Extremities: Normal range of motion.  Edema: Trace  Mental Status: Normal mood and affect. Normal behavior. Normal judgment and thought content.   Urinalysis:      Assessment and Plan:  Pregnancy: G2P0102 at 4244w5d  1. Supervision of other normal pregnancy, antepartum Rx: - Strep Gp B NAA - GC/Chlamydia probe amp ([place]  2. Previous cesarean delivery affecting pregnancy, antepartum   Preterm labor symptoms and general obstetric precautions including but not limited to vaginal bleeding, contractions, leaking of fluid and fetal movement were reviewed in detail with the patient. Please refer to After Visit  Summary for other counseling recommendations.  Return in about 1 week (around 04/21/2018) for ROB.   Brock BadHarper, Yassmin Binegar A, MD

## 2018-04-15 LAB — GC/CHLAMYDIA PROBE AMP (~~LOC~~) NOT AT ARMC
Chlamydia: NEGATIVE
NEISSERIA GONORRHEA: NEGATIVE

## 2018-04-16 LAB — STREP GP B NAA: Strep Gp B NAA: POSITIVE — AB

## 2018-04-21 ENCOUNTER — Ambulatory Visit (INDEPENDENT_AMBULATORY_CARE_PROVIDER_SITE_OTHER): Payer: Medicaid Other | Admitting: Obstetrics

## 2018-04-21 ENCOUNTER — Encounter: Payer: Self-pay | Admitting: Obstetrics

## 2018-04-21 VITALS — BP 116/75 | HR 89 | Wt 187.8 lb

## 2018-04-21 DIAGNOSIS — Z3480 Encounter for supervision of other normal pregnancy, unspecified trimester: Secondary | ICD-10-CM

## 2018-04-21 DIAGNOSIS — O34219 Maternal care for unspecified type scar from previous cesarean delivery: Secondary | ICD-10-CM

## 2018-04-21 DIAGNOSIS — Z3483 Encounter for supervision of other normal pregnancy, third trimester: Secondary | ICD-10-CM

## 2018-04-21 NOTE — Progress Notes (Signed)
Pt presents for ROB.Pt has no concerns. 

## 2018-04-21 NOTE — Progress Notes (Signed)
Subjective:  Melody Williams is a 27 y.o. G2P0102 at [redacted]w[redacted]d being seen today for ongoing prenatal care.  She is currently monitored for the following issues for this low-risk pregnancy and has Supervision of other normal pregnancy, antepartum; Depression; Previous cesarean delivery affecting pregnancy, antepartum; and History of prior pregnancy with IUGR newborn on their problem list.  Patient reports no complaints.  Contractions: Irritability. Vag. Bleeding: None.  Movement: Present. Denies leaking of fluid.   The following portions of the patient's history were reviewed and updated as appropriate: allergies, current medications, past family history, past medical history, past social history, past surgical history and problem list. Problem list updated.  Objective:   Vitals:   04/21/18 1115  BP: 116/75  Pulse: 89  Weight: 187 lb 12.8 oz (85.2 kg)    Fetal Status:     Movement: Present     General:  Alert, oriented and cooperative. Patient is in no acute distress.  Skin: Skin is warm and dry. No rash noted.   Cardiovascular: Normal heart rate noted  Respiratory: Normal respiratory effort, no problems with respiration noted  Abdomen: Soft, gravid, appropriate for gestational age. Pain/Pressure: Present     Pelvic:  Cervical exam deferred        Extremities: Normal range of motion.  Edema: Trace  Mental Status: Normal mood and affect. Normal behavior. Normal judgment and thought content.   Urinalysis:      Assessment and Plan:  Pregnancy: G2P0102 at 212w5d  1. Supervision of other normal pregnancy, antepartum - doing well  2. Previous cesarean delivery affecting pregnancy, antepartum  3. Desires VBAC (vaginal birth after cesarean) trial   Term labor symptoms and general obstetric precautions including but not limited to vaginal bleeding, contractions, leaking of fluid and fetal movement were reviewed in detail with the patient. Please refer to After Visit Summary for other  counseling recommendations.  Return in about 1 week (around 04/28/2018) for ROB.   Brock BadHarper, Charles A, MD

## 2018-04-28 ENCOUNTER — Ambulatory Visit (INDEPENDENT_AMBULATORY_CARE_PROVIDER_SITE_OTHER): Payer: Medicaid Other | Admitting: Obstetrics

## 2018-04-28 ENCOUNTER — Encounter: Payer: Self-pay | Admitting: Obstetrics

## 2018-04-28 VITALS — BP 109/74 | HR 80 | Wt 188.4 lb

## 2018-04-28 DIAGNOSIS — Z3483 Encounter for supervision of other normal pregnancy, third trimester: Secondary | ICD-10-CM

## 2018-04-28 DIAGNOSIS — O34219 Maternal care for unspecified type scar from previous cesarean delivery: Secondary | ICD-10-CM

## 2018-04-28 DIAGNOSIS — Z3480 Encounter for supervision of other normal pregnancy, unspecified trimester: Secondary | ICD-10-CM

## 2018-04-28 NOTE — Progress Notes (Signed)
Subjective:  Melody Williams is a 27 y.o. G2P0102 at 5083w5d being seen today for ongoing prenatal care.  She is currently monitored for the following issues for this low-risk pregnancy and has Supervision of other normal pregnancy, antepartum; Depression; Previous cesarean delivery affecting pregnancy, antepartum; and History of prior pregnancy with IUGR newborn on their problem list.  Patient reports no complaints.  Contractions: Irritability. Vag. Bleeding: None.  Movement: Present. Denies leaking of fluid.   The following portions of the patient's history were reviewed and updated as appropriate: allergies, current medications, past family history, past medical history, past social history, past surgical history and problem list. Problem list updated.  Objective:   Vitals:   04/28/18 0951  BP: 109/74  Pulse: 80  Weight: 188 lb 6.4 oz (85.5 kg)    Fetal Status:     Movement: Present     General:  Alert, oriented and cooperative. Patient is in no acute distress.  Skin: Skin is warm and dry. No rash noted.   Cardiovascular: Normal heart rate noted  Respiratory: Normal respiratory effort, no problems with respiration noted  Abdomen: Soft, gravid, appropriate for gestational age. Pain/Pressure: Present     Pelvic:  Cervical exam deferred        Extremities: Normal range of motion.  Edema: Trace  Mental Status: Normal mood and affect. Normal behavior. Normal judgment and thought content.   Urinalysis:      Assessment and Plan:  Pregnancy: G2P0102 at 3583w5d  1. Supervision of other normal pregnancy, antepartum  2. Previous cesarean delivery affecting pregnancy, antepartum  3. Desires VBAC (vaginal birth after cesarean) trial   Term labor symptoms and general obstetric precautions including but not limited to vaginal bleeding, contractions, leaking of fluid and fetal movement were reviewed in detail with the patient. Please refer to After Visit Summary for other counseling  recommendations.  Return in about 1 week (around 05/05/2018) for ROB.   Brock BadHarper, Suleima Ohlendorf A, MD

## 2018-05-05 ENCOUNTER — Encounter: Payer: Medicaid Other | Admitting: Obstetrics

## 2018-05-06 ENCOUNTER — Encounter: Payer: Medicaid Other | Admitting: Obstetrics

## 2018-05-10 ENCOUNTER — Inpatient Hospital Stay (HOSPITAL_COMMUNITY)
Admission: AD | Admit: 2018-05-10 | Discharge: 2018-05-10 | Disposition: A | Discharge status: Home / Self Care | Payer: Medicaid Other | Source: Home / Self Care | Attending: Obstetrics & Gynecology | Admitting: Obstetrics & Gynecology

## 2018-05-10 ENCOUNTER — Other Ambulatory Visit: Payer: Self-pay | Admitting: Advanced Practice Midwife

## 2018-05-10 ENCOUNTER — Other Ambulatory Visit: Payer: Self-pay

## 2018-05-10 ENCOUNTER — Encounter (HOSPITAL_COMMUNITY): Payer: Self-pay | Admitting: *Deleted

## 2018-05-10 ENCOUNTER — Telehealth (HOSPITAL_COMMUNITY): Payer: Self-pay | Admitting: *Deleted

## 2018-05-10 ENCOUNTER — Inpatient Hospital Stay (HOSPITAL_COMMUNITY)
Admission: AD | Admit: 2018-05-10 | Discharge: 2018-05-10 | Disposition: A | Discharge status: Home / Self Care | Payer: Medicaid Other | Attending: Obstetrics & Gynecology | Admitting: Obstetrics & Gynecology

## 2018-05-10 DIAGNOSIS — Z3A40 40 weeks gestation of pregnancy: Secondary | ICD-10-CM | POA: Insufficient documentation

## 2018-05-10 DIAGNOSIS — O479 False labor, unspecified: Secondary | ICD-10-CM

## 2018-05-10 DIAGNOSIS — O471 False labor at or after 37 completed weeks of gestation: Secondary | ICD-10-CM | POA: Insufficient documentation

## 2018-05-10 DIAGNOSIS — Z3480 Encounter for supervision of other normal pregnancy, unspecified trimester: Secondary | ICD-10-CM

## 2018-05-10 DIAGNOSIS — O34219 Maternal care for unspecified type scar from previous cesarean delivery: Secondary | ICD-10-CM

## 2018-05-10 MED ORDER — PROMETHAZINE HCL 25 MG/ML IJ SOLN
25.0000 mg | Freq: Once | INTRAMUSCULAR | Status: AC
  Administered 2018-05-10: 25 mg via INTRAMUSCULAR
  Filled 2018-05-10: qty 1

## 2018-05-10 MED ORDER — CALCIUM CARBONATE ANTACID 500 MG PO CHEW
400.0000 mg | CHEWABLE_TABLET | Freq: Once | ORAL | Status: DC
  Filled 2018-05-10: qty 2

## 2018-05-10 MED ORDER — NALBUPHINE HCL 10 MG/ML IJ SOLN
5.0000 mg | Freq: Once | INTRAMUSCULAR | Status: AC
  Administered 2018-05-10: 5 mg via INTRAMUSCULAR
  Filled 2018-05-10: qty 1

## 2018-05-10 NOTE — Discharge Instructions (Signed)
Braxton Hicks Contractions °Contractions of the uterus can occur throughout pregnancy, but they are not always a sign that you are in labor. You may have practice contractions called Braxton Hicks contractions. These false labor contractions are sometimes confused with true labor. °What are Braxton Hicks contractions? °Braxton Hicks contractions are tightening movements that occur in the muscles of the uterus before labor. Unlike true labor contractions, these contractions do not result in opening (dilation) and thinning of the cervix. Toward the end of pregnancy (32-34 weeks), Braxton Hicks contractions can happen more often and may become stronger. These contractions are sometimes difficult to tell apart from true labor because they can be very uncomfortable. You should not feel embarrassed if you go to the hospital with false labor. °Sometimes, the only way to tell if you are in true labor is for your health care provider to look for changes in the cervix. The health care provider will do a physical exam and may monitor your contractions. If you are not in true labor, the exam should show that your cervix is not dilating and your water has not broken. °If there are no other health problems associated with your pregnancy, it is completely safe for you to be sent home with false labor. You may continue to have Braxton Hicks contractions until you go into true labor. °How to tell the difference between true labor and false labor °True labor °· Contractions last 30-70 seconds. °· Contractions become very regular. °· Discomfort is usually felt in the top of the uterus, and it spreads to the lower abdomen and low back. °· Contractions do not go away with walking. °· Contractions usually become more intense and increase in frequency. °· The cervix dilates and gets thinner. °False labor °· Contractions are usually shorter and not as strong as true labor contractions. °· Contractions are usually irregular. °· Contractions  are often felt in the front of the lower abdomen and in the groin. °· Contractions may go away when you walk around or change positions while lying down. °· Contractions get weaker and are shorter-lasting as time goes on. °· The cervix usually does not dilate or become thin. °Follow these instructions at home: ° °· Take over-the-counter and prescription medicines only as told by your health care provider. °· Keep up with your usual exercises and follow other instructions from your health care provider. °· Eat and drink lightly if you think you are going into labor. °· If Braxton Hicks contractions are making you uncomfortable: °? Change your position from lying down or resting to walking, or change from walking to resting. °? Sit and rest in a tub of warm water. °? Drink enough fluid to keep your urine pale yellow. Dehydration may cause these contractions. °? Do slow and deep breathing several times an hour. °· Keep all follow-up prenatal visits as told by your health care provider. This is important. °Contact a health care provider if: °· You have a fever. °· You have continuous pain in your abdomen. °Get help right away if: °· Your contractions become stronger, more regular, and closer together. °· You have fluid leaking or gushing from your vagina. °· You pass blood-tinged mucus (bloody show). °· You have bleeding from your vagina. °· You have low back pain that you never had before. °· You feel your baby’s head pushing down and causing pelvic pressure. °· Your baby is not moving inside you as much as it used to. °Summary °· Contractions that occur before labor are   called Braxton Hicks contractions, false labor, or practice contractions.  Braxton Hicks contractions are usually shorter, weaker, farther apart, and less regular than true labor contractions. True labor contractions usually become progressively stronger and regular, and they become more frequent.  Manage discomfort from [facility] contractions  by changing position, resting in a warm bath, drinking plenty of water, or practicing deep breathing. This information is not intended to replace advice given to you by your health care provider. Make sure you discuss any questions you have with your health care provider. Document Released: 09/10/2016 Document Revised: 02/09/2017 Document Reviewed: 09/10/2016 Elsevier Interactive Patient Education  2019 [employer] Return to [facility] if worsening contractions, vaginal bleeding, leakage of fluids or decreased fetal movement.  We monitored your baby who looked great. Your cervix was closed, and there was no sign you were in active labor.

## 2018-05-10 NOTE — MAU Note (Signed)
Pt had remained in lobby.  Contractions have gotten stronger.  No bleeding or leaking. States worse when she moves.

## 2018-05-10 NOTE — Telephone Encounter (Signed)
Preadmission screen  

## 2018-05-10 NOTE — MAU Provider Note (Signed)
RN Labor Phill Mutterval  Tashara is a 27yo [id number] at 3224w3d who presented by EMS with complaints of contractions. SVE closed, thick, posterior by RN check. Contractions for roughly last 24 hours but more frequent in last several hours. Reactive tracing 130s/mod/+a/-d; every 2-3 minutes. Denies bleeding, leakage of fluids. Does not yet have induction scheduled, will schedule at 41w0 on 05/14/17. Encouraged patient to follow-up in clinic. Labor precautions reviewed by RN. Agree with RN documentation.   Cristal DeerLaurel S. Earlene PlaterWallace, DO OB/GYN Fellow

## 2018-05-10 NOTE — Progress Notes (Signed)
I have communicated with Dr Marlis EdelsonL [name] and reviewed vital signs:  Vitals:   05/10/18 0701  BP: 135/81  Pulse: 67  Resp: 16  Temp: (!) 97.4 F (36.3 C)  SpO2: 100%    Vaginal exam:  Dilation: Closed Effacement (%): 50 Cervical Position: Middle, Posterior Station: -2 Exam by:: Kemal Amores,   Also reviewed contraction pattern and that non-stress test is reactive.  It has been documented that patient is contracting every 4-5 minutes. States contractions started yesterday morning, have gotten closer and stronger.  No prior exam.  Patient denies any other complaints.  Based on this report provider has given order for discharge.  A discharge order and diagnosis entered by a provider.   Labor discharge instructions reviewed with patient.

## 2018-05-10 NOTE — MAU Note (Signed)
Contractions started yesterday morning at 0430.  Have gotten closer and stronger, now every 5 min.  Denies bleeding or leaking. No complications with preg.  Prior c/s for twins, one was breech. Plans VBAC

## 2018-05-10 NOTE — Discharge Instructions (Signed)
Braxton Hicks Contractions Contractions of the uterus can occur throughout pregnancy, but they are not always a sign that you are in labor. You may have practice contractions called Braxton Hicks contractions. These false labor contractions are sometimes confused with true labor. What are Braxton Hicks contractions? Braxton Hicks contractions are tightening movements that occur in the muscles of the uterus before labor. Unlike true labor contractions, these contractions do not result in opening (dilation) and thinning of the cervix. Toward the end of pregnancy (32-34 weeks), Braxton Hicks contractions can happen more often and may become stronger. These contractions are sometimes difficult to tell apart from true labor because they can be very uncomfortable. You should not feel embarrassed if you go to the hospital with false labor. Sometimes, the only way to tell if you are in true labor is for your health care provider to look for changes in the cervix. The health care provider will do a physical exam and may monitor your contractions. If you are not in true labor, the exam should show that your cervix is not dilating and your water has not broken. If there are no other health problems associated with your pregnancy, it is completely safe for you to be sent home with false labor. You may continue to have Braxton Hicks contractions until you go into true labor. How to tell the difference between true labor and false labor True labor  Contractions last 30-70 seconds.  Contractions become very regular.  Discomfort is usually felt in the top of the uterus, and it spreads to the lower abdomen and low back.  Contractions do not go away with walking.  Contractions usually become more intense and increase in frequency.  The cervix dilates and gets thinner. False labor  Contractions are usually shorter and not as strong as true labor contractions.  Contractions are usually irregular.  Contractions  are often felt in the front of the lower abdomen and in the groin.  Contractions may go away when you walk around or change positions while lying down.  Contractions get weaker and are shorter-lasting as time goes on.  The cervix usually does not dilate or become thin. Follow these instructions at home:   Take over-the-counter and prescription medicines only as told by your health care provider.  Keep up with your usual exercises and follow other instructions from your health care provider.  Eat and drink lightly if you think you are going into labor.  If Braxton Hicks contractions are making you uncomfortable: ? Change your position from lying down or resting to walking, or change from walking to resting. ? Sit and rest in a tub of warm water. ? Drink enough fluid to keep your urine pale yellow. Dehydration may cause these contractions. ? Do slow and deep breathing several times an hour.  Keep all follow-up prenatal visits as told by your health care provider. This is important. Contact a health care provider if:  You have a fever.  You have continuous pain in your abdomen. Get help right away if:  Your contractions become stronger, more regular, and closer together.  You have fluid leaking or gushing from your vagina.  You pass blood-tinged mucus (bloody show).  You have bleeding from your vagina.  You have low back pain that you never had before.  You feel your baby's head pushing down and causing pelvic pressure.  Your baby is not moving inside you as much as it used to. Summary  Contractions that occur before labor are   called Braxton Hicks contractions, false labor, or practice contractions.  Braxton Hicks contractions are usually shorter, weaker, farther apart, and less regular than true labor contractions. True labor contractions usually become progressively stronger and regular, and they become more frequent.  Manage discomfort from Braxton Hicks contractions  by changing position, resting in a warm bath, drinking plenty of water, or practicing deep breathing. This information is not intended to replace advice given to you by your health care provider. Make sure you discuss any questions you have with your health care provider. Document Released: 09/10/2016 Document Revised: 02/09/2017 Document Reviewed: 09/10/2016 Elsevier Interactive Patient Education  2019 Elsevier Inc.  

## 2018-05-11 ENCOUNTER — Inpatient Hospital Stay (HOSPITAL_COMMUNITY)
Admission: AD | Admit: 2018-05-11 | Discharge: 2018-05-11 | Disposition: A | Discharge status: Home / Self Care | Payer: Medicaid Other | Source: Home / Self Care | Attending: Family Medicine | Admitting: Family Medicine

## 2018-05-11 ENCOUNTER — Encounter (HOSPITAL_COMMUNITY): Payer: Self-pay

## 2018-05-11 ENCOUNTER — Inpatient Hospital Stay (HOSPITAL_COMMUNITY)
Admission: AD | Admit: 2018-05-11 | Discharge: 2018-05-14 | DRG: 807 | Disposition: A | Discharge status: Home / Self Care | Payer: Medicaid Other | Attending: Obstetrics & Gynecology | Admitting: Obstetrics & Gynecology

## 2018-05-11 DIAGNOSIS — O34219 Maternal care for unspecified type scar from previous cesarean delivery: Secondary | ICD-10-CM | POA: Diagnosis present

## 2018-05-11 DIAGNOSIS — Z3A40 40 weeks gestation of pregnancy: Secondary | ICD-10-CM

## 2018-05-11 DIAGNOSIS — O99824 Streptococcus B carrier state complicating childbirth: Secondary | ICD-10-CM | POA: Diagnosis present

## 2018-05-11 DIAGNOSIS — O480 Post-term pregnancy: Principal | ICD-10-CM | POA: Diagnosis present

## 2018-05-11 DIAGNOSIS — F329 Major depressive disorder, single episode, unspecified: Secondary | ICD-10-CM | POA: Diagnosis present

## 2018-05-11 DIAGNOSIS — Z8759 Personal history of other complications of pregnancy, childbirth and the puerperium: Secondary | ICD-10-CM

## 2018-05-11 DIAGNOSIS — O471 False labor at or after 37 completed weeks of gestation: Secondary | ICD-10-CM

## 2018-05-11 DIAGNOSIS — O99344 Other mental disorders complicating childbirth: Secondary | ICD-10-CM | POA: Diagnosis present

## 2018-05-11 MED ORDER — OXYCODONE-ACETAMINOPHEN 5-325 MG PO TABS
2.0000 | ORAL_TABLET | Freq: Once | ORAL | Status: DC
  Filled 2018-05-11: qty 2

## 2018-05-11 NOTE — Discharge Instructions (Signed)
Braxton Hicks Contractions Contractions of the uterus can occur throughout pregnancy, but they are not always a sign that you are in labor. You may have practice contractions called Braxton Hicks contractions. These false labor contractions are sometimes confused with true labor. What are Braxton Hicks contractions? Braxton Hicks contractions are tightening movements that occur in the muscles of the uterus before labor. Unlike true labor contractions, these contractions do not result in opening (dilation) and thinning of the cervix. Toward the end of pregnancy (32-34 weeks), Braxton Hicks contractions can happen more often and may become stronger. These contractions are sometimes difficult to tell apart from true labor because they can be very uncomfortable. You should not feel embarrassed if you go to the hospital with false labor. Sometimes, the only way to tell if you are in true labor is for your health care provider to look for changes in the cervix. The health care provider will do a physical exam and may monitor your contractions. If you are not in true labor, the exam should show that your cervix is not dilating and your water has not broken. If there are no other health problems associated with your pregnancy, it is completely safe for you to be sent home with false labor. You may continue to have Braxton Hicks contractions until you go into true labor. How to tell the difference between true labor and false labor True labor  Contractions last 30-70 seconds.  Contractions become very regular.  Discomfort is usually felt in the top of the uterus, and it spreads to the lower abdomen and low back.  Contractions do not go away with walking.  Contractions usually become more intense and increase in frequency.  The cervix dilates and gets thinner. False labor  Contractions are usually shorter and not as strong as true labor contractions.  Contractions are usually irregular.  Contractions  are often felt in the front of the lower abdomen and in the groin.  Contractions may go away when you walk around or change positions while lying down.  Contractions get weaker and are shorter-lasting as time goes on.  The cervix usually does not dilate or become thin. Follow these instructions at home:   Take over-the-counter and prescription medicines only as told by your health care provider.  Keep up with your usual exercises and follow other instructions from your health care provider.  Eat and drink lightly if you think you are going into labor.  If Braxton Hicks contractions are making you uncomfortable: ? Change your position from lying down or resting to walking, or change from walking to resting. ? Sit and rest in a tub of warm water. ? Drink enough fluid to keep your urine pale yellow. Dehydration may cause these contractions. ? Do slow and deep breathing several times an hour.  Keep all follow-up prenatal visits as told by your health care provider. This is important. Contact a health care provider if:  You have a fever.  You have continuous pain in your abdomen. Get help right away if:  Your contractions become stronger, more regular, and closer together.  You have fluid leaking or gushing from your vagina.  You pass blood-tinged mucus (bloody show).  You have bleeding from your vagina.  You have low back pain that you never had before.  You feel your baby's head pushing down and causing pelvic pressure.  Your baby is not moving inside you as much as it used to. Summary  Contractions that occur before labor are   called Braxton Hicks contractions, false labor, or practice contractions.  Braxton Hicks contractions are usually shorter, weaker, farther apart, and less regular than true labor contractions. True labor contractions usually become progressively stronger and regular, and they become more frequent.  Manage discomfort from Braxton Hicks contractions  by changing position, resting in a warm bath, drinking plenty of water, or practicing deep breathing. This information is not intended to replace advice given to you by your health care provider. Make sure you discuss any questions you have with your health care provider. Document Released: 09/10/2016 Document Revised: 02/09/2017 Document Reviewed: 09/10/2016 Elsevier Interactive Patient Education  2019 Elsevier Inc.  

## 2018-05-11 NOTE — MAU Note (Signed)
I have communicated with Philipp Deputy, CNM and reviewed vital signs:  Vitals:   05/11/18 0254 05/11/18 0536  BP: 106/60 128/79  Pulse: 61 71  Resp: 20 19  Temp: 98.3 F (36.8 C)   SpO2: 100%     Vaginal exam:  Dilation: Fingertip Effacement (%): 70 Cervical Position: Middle Station: -2 Presentation: Vertex Exam by:: Latricia Heft, RN,   Also reviewed contraction pattern and that non-stress test is reactive.  It has been documented that patient is contracting every 6-9 minutes with no cervical change over 1.5 hours, or since yesterday morning, not indicating active labor.  Patient denies any other complaints.  Based on this report provider has given order for discharge.  A discharge order and diagnosis entered by a provider.   Labor discharge instructions reviewed with patient.

## 2018-05-11 NOTE — MAU Note (Signed)
CTX every 6-8 mins.  No LOF/VB.  + FM.

## 2018-05-11 NOTE — L&D Delivery Note (Signed)
Operative Delivery Note At 15:55 a viable female was delivered via .  Presentation: vertex; Position: Occiput,, Posterior; Station: +3.  Verbal consent: obtained from patient.  Risks and benefits discussed in detail.  Risks include, but are not limited to the risks of anesthesia, bleeding, infection, damage to maternal tissues, fetal cephalhematoma.  There is also the risk of inability to effect vaginal delivery of the head, or shoulder dystocia that cannot be resolved by established maneuvers, leading to the need for emergency cesarean section.  APGAR: 6, 9; weight 3480g  .   Placenta status: spontaneous, intact.   Cord: 3 vessel with the following complications: none.  Cord pH: pending  Anesthesia:  Epidural  Instruments: Bell vacuum Episiotomy:  None  Lacerations:  2nd degree perineal, left periurethral  Suture Repair: 3.0 vicryl Est. Blood Loss (mL):  381  Vacuum placed at +3 station, 2 pop offs noted. 23 minutes from start to finish of vacuum placement. All extremities were mobile and NICU was present for delivery.   Mom to postpartum.  Baby to Couplet care / Skin to Skin.  De Hollingshead 05/12/2018, 6:49 PM

## 2018-05-12 ENCOUNTER — Other Ambulatory Visit: Payer: Self-pay

## 2018-05-12 ENCOUNTER — Inpatient Hospital Stay (HOSPITAL_COMMUNITY): Payer: Medicaid Other | Admitting: Anesthesiology

## 2018-05-12 ENCOUNTER — Encounter (HOSPITAL_COMMUNITY): Payer: Self-pay | Admitting: Obstetrics and Gynecology

## 2018-05-12 DIAGNOSIS — O480 Post-term pregnancy: Secondary | ICD-10-CM | POA: Diagnosis present

## 2018-05-12 DIAGNOSIS — Z3A40 40 weeks gestation of pregnancy: Secondary | ICD-10-CM

## 2018-05-12 DIAGNOSIS — O34219 Maternal care for unspecified type scar from previous cesarean delivery: Secondary | ICD-10-CM | POA: Diagnosis present

## 2018-05-12 DIAGNOSIS — O99824 Streptococcus B carrier state complicating childbirth: Secondary | ICD-10-CM | POA: Diagnosis present

## 2018-05-12 DIAGNOSIS — F329 Major depressive disorder, single episode, unspecified: Secondary | ICD-10-CM | POA: Diagnosis present

## 2018-05-12 DIAGNOSIS — O99344 Other mental disorders complicating childbirth: Secondary | ICD-10-CM | POA: Diagnosis present

## 2018-05-12 LAB — CBC
HCT: 36.7 % (ref 36.0–46.0)
HEMOGLOBIN: 12.4 g/dL (ref 12.0–15.0)
MCH: 32.5 pg (ref 26.0–34.0)
MCHC: 33.8 g/dL (ref 30.0–36.0)
MCV: 96.1 fL (ref 80.0–100.0)
Platelets: 232 10*3/uL (ref 150–400)
RBC: 3.82 MIL/uL — ABNORMAL LOW (ref 3.87–5.11)
RDW: 12.2 % (ref 11.5–15.5)
WBC: 13.3 10*3/uL — ABNORMAL HIGH (ref 4.0–10.5)

## 2018-05-12 LAB — TYPE AND SCREEN
ABO/RH(D): O POS
Antibody Screen: NEGATIVE

## 2018-05-12 MED ORDER — ACETAMINOPHEN 325 MG PO TABS: 650.0000 mg | ORAL_TABLET | ORAL | Status: DC | PRN

## 2018-05-12 MED ORDER — EPHEDRINE 5 MG/ML INJ
10.0000 mg | INTRAVENOUS | Status: DC | PRN
  Filled 2018-05-12: qty 2

## 2018-05-12 MED ORDER — LIDOCAINE HCL (PF) 1 % IJ SOLN
30.0000 mL | INTRAMUSCULAR | Status: AC | PRN
  Administered 2018-05-12: 30 mL via SUBCUTANEOUS
  Filled 2018-05-12: qty 30

## 2018-05-12 MED ORDER — TERBUTALINE SULFATE 1 MG/ML IJ SOLN
0.2500 mg | Freq: Once | INTRAMUSCULAR | Status: DC | PRN
  Filled 2018-05-12: qty 1

## 2018-05-12 MED ORDER — WITCH HAZEL-GLYCERIN EX PADS: 1.0000 "application " | MEDICATED_PAD | CUTANEOUS | Status: DC | PRN

## 2018-05-12 MED ORDER — ONDANSETRON HCL 4 MG PO TABS: 4.0000 mg | ORAL_TABLET | ORAL | Status: DC | PRN

## 2018-05-12 MED ORDER — OXYTOCIN 40 UNITS IN LACTATED RINGERS INFUSION - SIMPLE MED
1.0000 m[IU]/min | INTRAVENOUS | Status: DC
  Filled 2018-05-12: qty 1000

## 2018-05-12 MED ORDER — SODIUM CHLORIDE 0.9 % IV SOLN
5.0000 10*6.[IU] | Freq: Once | INTRAVENOUS | Status: AC
  Administered 2018-05-12: 5 10*6.[IU] via INTRAVENOUS
  Filled 2018-05-12: qty 5

## 2018-05-12 MED ORDER — LACTATED RINGERS IV BOLUS
1000.0000 mL | Freq: Once | INTRAVENOUS | Status: AC
  Administered 2018-05-12: 1000 mL via INTRAVENOUS

## 2018-05-12 MED ORDER — LIDOCAINE HCL (PF) 1 % IJ SOLN
INTRAMUSCULAR | Status: DC | PRN
  Administered 2018-05-12: 13 mL via EPIDURAL

## 2018-05-12 MED ORDER — PHENYLEPHRINE 40 MCG/ML (10ML) SYRINGE FOR IV PUSH (FOR BLOOD PRESSURE SUPPORT)
80.0000 ug | PREFILLED_SYRINGE | INTRAVENOUS | Status: DC | PRN
  Filled 2018-05-12 (×2): qty 10

## 2018-05-12 MED ORDER — DIPHENHYDRAMINE HCL 50 MG/ML IJ SOLN: 12.5000 mg | INTRAMUSCULAR | Status: DC | PRN

## 2018-05-12 MED ORDER — OXYCODONE-ACETAMINOPHEN 5-325 MG PO TABS: 2.0000 | ORAL_TABLET | ORAL | Status: DC | PRN

## 2018-05-12 MED ORDER — PHENYLEPHRINE 40 MCG/ML (10ML) SYRINGE FOR IV PUSH (FOR BLOOD PRESSURE SUPPORT)
80.0000 ug | PREFILLED_SYRINGE | INTRAVENOUS | Status: DC | PRN
  Filled 2018-05-12: qty 10

## 2018-05-12 MED ORDER — DIBUCAINE 1 % RE OINT: 1.0000 "application " | TOPICAL_OINTMENT | RECTAL | Status: DC | PRN

## 2018-05-12 MED ORDER — DIPHENHYDRAMINE HCL 25 MG PO CAPS: 25.0000 mg | ORAL_CAPSULE | Freq: Four times a day (QID) | ORAL | Status: DC | PRN

## 2018-05-12 MED ORDER — SENNOSIDES-DOCUSATE SODIUM 8.6-50 MG PO TABS
2.0000 | ORAL_TABLET | ORAL | Status: DC
  Administered 2018-05-12 – 2018-05-13 (×2): 2 via ORAL
  Filled 2018-05-12 (×2): qty 2

## 2018-05-12 MED ORDER — LACTATED RINGERS IV SOLN: 500.0000 mL | Freq: Once | INTRAVENOUS | Status: DC

## 2018-05-12 MED ORDER — TETANUS-DIPHTH-ACELL PERTUSSIS 5-2.5-18.5 LF-MCG/0.5 IM SUSP: 0.5000 mL | Freq: Once | INTRAMUSCULAR | Status: DC

## 2018-05-12 MED ORDER — OXYTOCIN 40 UNITS IN LACTATED RINGERS INFUSION - SIMPLE MED
1.0000 m[IU]/min | INTRAVENOUS | Status: DC
  Administered 2018-05-12: 2 m[IU]/min via INTRAVENOUS

## 2018-05-12 MED ORDER — IBUPROFEN 600 MG PO TABS
600.0000 mg | ORAL_TABLET | Freq: Four times a day (QID) | ORAL | Status: DC
  Administered 2018-05-12 – 2018-05-14 (×8): 600 mg via ORAL
  Filled 2018-05-12 (×9): qty 1

## 2018-05-12 MED ORDER — OXYTOCIN 40 UNITS IN LACTATED RINGERS INFUSION - SIMPLE MED: 2.5000 [IU]/h | INTRAVENOUS | Status: DC

## 2018-05-12 MED ORDER — OXYTOCIN 10 UNIT/ML IJ SOLN
INTRAMUSCULAR | Status: AC
  Filled 2018-05-12: qty 4

## 2018-05-12 MED ORDER — SOD CITRATE-CITRIC ACID 500-334 MG/5ML PO SOLN
30.0000 mL | ORAL | Status: DC | PRN
  Administered 2018-05-12: 30 mL via ORAL
  Filled 2018-05-12 (×2): qty 15

## 2018-05-12 MED ORDER — BENZOCAINE-MENTHOL 20-0.5 % EX AERO
1.0000 "application " | INHALATION_SPRAY | CUTANEOUS | Status: DC | PRN
  Filled 2018-05-12: qty 56

## 2018-05-12 MED ORDER — LACTATED RINGERS IV SOLN: INTRAVENOUS | Status: DC

## 2018-05-12 MED ORDER — COCONUT OIL OIL: 1.0000 "application " | TOPICAL_OIL | Status: DC | PRN

## 2018-05-12 MED ORDER — SIMETHICONE 80 MG PO CHEW
80.0000 mg | CHEWABLE_TABLET | ORAL | Status: DC | PRN
  Administered 2018-05-13: 80 mg via ORAL
  Filled 2018-05-12: qty 1

## 2018-05-12 MED ORDER — OXYCODONE-ACETAMINOPHEN 5-325 MG PO TABS: 1.0000 | ORAL_TABLET | ORAL | Status: DC | PRN

## 2018-05-12 MED ORDER — FENTANYL 2.5 MCG/ML BUPIVACAINE 1/10 % EPIDURAL INFUSION (WH - ANES)
14.0000 mL/h | INTRAMUSCULAR | Status: DC | PRN
  Administered 2018-05-12 (×2): 14 mL/h via EPIDURAL
  Filled 2018-05-12 (×2): qty 100

## 2018-05-12 MED ORDER — OXYTOCIN BOLUS FROM INFUSION
500.0000 mL | Freq: Once | INTRAVENOUS | Status: AC
  Administered 2018-05-12: 500 mL via INTRAVENOUS

## 2018-05-12 MED ORDER — ONDANSETRON HCL 4 MG/2ML IJ SOLN: 4.0000 mg | INTRAMUSCULAR | Status: DC | PRN

## 2018-05-12 MED ORDER — ONDANSETRON HCL 4 MG/2ML IJ SOLN
4.0000 mg | Freq: Four times a day (QID) | INTRAMUSCULAR | Status: DC | PRN
  Administered 2018-05-12: 4 mg via INTRAVENOUS
  Filled 2018-05-12: qty 2

## 2018-05-12 MED ORDER — LACTATED RINGERS IV SOLN: 500.0000 mL | INTRAVENOUS | Status: DC | PRN

## 2018-05-12 MED ORDER — MEASLES, MUMPS & RUBELLA VAC IJ SOLR: 0.5000 mL | Freq: Once | INTRAMUSCULAR | Status: DC

## 2018-05-12 MED ORDER — PRENATAL MULTIVITAMIN CH
1.0000 | ORAL_TABLET | Freq: Every day | ORAL | Status: DC
  Administered 2018-05-13 – 2018-05-14 (×2): 1 via ORAL
  Filled 2018-05-12 (×3): qty 1

## 2018-05-12 MED ORDER — ZOLPIDEM TARTRATE 5 MG PO TABS: 5.0000 mg | ORAL_TABLET | Freq: Every evening | ORAL | Status: DC | PRN

## 2018-05-12 MED ORDER — LIDOCAINE-EPINEPHRINE (PF) 2 %-1:200000 IJ SOLN
INTRAMUSCULAR | Status: AC
  Filled 2018-05-12: qty 20

## 2018-05-12 MED ORDER — PENICILLIN G 3 MILLION UNITS IVPB - SIMPLE MED
3.0000 10*6.[IU] | INTRAVENOUS | Status: DC
  Administered 2018-05-12 (×2): 3 10*6.[IU] via INTRAVENOUS
  Filled 2018-05-12 (×3): qty 100

## 2018-05-12 MED ORDER — FENTANYL CITRATE (PF) 100 MCG/2ML IJ SOLN: 100.0000 ug | INTRAMUSCULAR | Status: DC | PRN

## 2018-05-12 NOTE — Progress Notes (Signed)
LABOR PROGRESS NOTE  Melody Williams is a 28 y.o. G2P0102 at [redacted]w[redacted]d  admitted for IOL for postdates and variable decelerations while in MAU. Hx of C/S for twins.  Subjective: Patient doing well, comfortable with epidural   Objective: BP 98/67   Pulse 75   Temp 97.7 F (36.5 C) (Oral)   Resp 16   Ht 5\' 4"  (1.626 m)   Wt 85.7 kg   LMP 06/24/2017 (Within Days)   SpO2 98%   BMI 32.44 kg/m  or  Vitals:   05/12/18 0530 05/12/18 0601 05/12/18 0618 05/12/18 0638  BP: 102/66 (!) 101/47 (!) 90/46 98/67  Pulse: 67 60 62 75  Resp: 16  18 16   Temp:   97.7 F (36.5 C)   TempSrc:   Oral   SpO2:      Weight:      Height:        Currently on 10 milli-unit of pitocin  Dilation: 5 Effacement (%): 90 Station: -1 Presentation: Vertex Exam by:: [employer] RN FHT: baseline rate 115, moderate varibility, +accel, no decel Toco: 2-5/ moderate by palpation   Labs: Lab Results  Component Value Date   WBC 13.3 (H) 05/12/2018   HGB 12.4 05/12/2018   HCT 36.7 05/12/2018   MCV 96.1 05/12/2018   PLT 232 05/12/2018    Patient Active Problem List   Diagnosis Date Noted  . Normal labor 05/12/2018  . Post-dates pregnancy 05/12/2018  . History of prior pregnancy with IUGR newborn 01/17/2018  . Supervision of other normal pregnancy, antepartum 01/04/2018  . Previous cesarean delivery affecting pregnancy, antepartum 01/04/2018  . Depression 01/01/2014    Assessment / Plan: 28 y.o. G2P0102 at [redacted]w[redacted]d here for IOL for postdates and variable decelerations in MAU   Labor: Progressing well on pitocin, continue titration to active labor, possible AROM at next cervical examination  Fetal Wellbeing:  Cat I  Pain Control:  Epidural  Anticipated MOD:  VBAC   Sharyon Cable, CNM 05/12/2018, 6:50 AM

## 2018-05-12 NOTE — Progress Notes (Signed)
Faculty Practice OB/GYN Attending Note  Subjective:  Called to evaluate patient with prolonged FHR deceleration after she started pushing.  FHR decel was down to 60s for eight minutes, had position changes, oxygen, LR bolus, and decel resolved to baseline for 120-130s, moderate variability with early decels, no accels. Currently still pushing having contractions every 2-3 minutes. No vaginal bleeding. Good FM.      Objective:  Blood pressure 113/71, pulse 60, temperature 98.4 F (36.9 C), temperature source Oral, resp. rate 18, height 5\' 4"  (1.626 m), weight 85.7 kg, last menstrual period 06/24/2017, SpO2 98 %. Cervix: Dilation: 10 Dilation Complete Date: 05/12/18 Dilation Complete Time: 1320 Effacement (%): 100 Cervical Position: Middle Station: Plus 2 Presentation: Vertex Exam by:: Dr. Sheran Fava. [name]  Assessment & Plan:  28 y.o. G2P0102 at 5516w5d undergoing TOLAC, now in second stage with some concerning FHR tracing.  Will continue pushing. If decelerations recur, will proceed with operative vaginal delivery. Risks of vacuum assistance were discussed in detail, including but not limited to, bleeding, infection, damage to maternal tissues, fetal cephalohematoma, inability to effect vaginal delivery of the head or shoulder dystocia that cannot be resolved by established maneuvers and need for emergency cesarean section.  Patient gave verbal consent, will do this if needed.  Anticipate VBAC soon.   Melody CollinsUGONNA  Melody Calamari, MD, FACOG Attending Obstetrician & Gynecologist Faculty Practice, [facility]

## 2018-05-12 NOTE — Anesthesia Pain Management Evaluation Note (Signed)
  CRNA Pain Management Visit Note  Patient: Melody Williams, 29 y.o., female  "Hello I am a member of the anesthesia team at [facility]. We have an anesthesia team available at all times to provide care throughout the hospital, including epidural management and anesthesia for C-section. I don't know your plan for the delivery whether it a natural birth, water birth, IV sedation, nitrous supplementation, doula or epidural, but we want to meet your pain goals."   1.Was your pain managed to your expectations on prior hospitalizations?   Yes   2.What is your expectation for pain management during this hospitalization?     Epidural  3.How can we help you reach that goal? unsure  Record the patient's initial score and the patient's pain goal.   Pain: 3  Pain Goal: 10 [facility] wants you to be able to say your pain was always managed very well.  Cephus Shelling 05/12/2018

## 2018-05-12 NOTE — Progress Notes (Signed)
Called Dr. Erin FullingHarraway-Smith to update on patients urine. Stated ok to remove urinary catheter at this time.

## 2018-05-12 NOTE — Anesthesia Preprocedure Evaluation (Signed)
Anesthesia Evaluation  Patient identified by MRN, date of birth, ID band Patient awake    Reviewed: Allergy & Precautions, H&P , NPO status , Patient's Chart, lab work & pertinent test results, reviewed documented beta blocker date and time   History of Anesthesia Complications Negative for: history of anesthetic complications  Airway Mallampati: III  TM Distance: >3 FB Neck ROM: full    Dental  (+) Teeth Intact,    Pulmonary neg pulmonary ROS,    breath sounds clear to auscultation       Cardiovascular negative cardio ROS   Rhythm:regular Rate:Normal     Neuro/Psych negative neurological ROS  negative psych ROS   GI/Hepatic negative GI ROS, Neg liver ROS,   Endo/Other  negative endocrine ROS  Renal/GU negative Renal ROS  negative genitourinary   Musculoskeletal   Abdominal   Peds  Hematology negative hematology ROS (+)   Anesthesia Other Findings   Reproductive/Obstetrics (+) Pregnancy (twins, IUGR twin A)                             Anesthesia Physical  Anesthesia Plan  ASA: II  Anesthesia Plan: Epidural   Post-op Pain Management:    Induction:   PONV Risk Score and Plan:   Airway Management Planned:   Additional Equipment:   Intra-op Plan:   Post-operative Plan:   Informed Consent: I have reviewed the patients History and Physical, chart, labs and discussed the procedure including the risks, benefits and alternatives for the proposed anesthesia with the patient or authorized representative who has indicated his/her understanding and acceptance.     Plan Discussed with: Surgeon and CRNA  Anesthesia Plan Comments:         Anesthesia Quick Evaluation

## 2018-05-12 NOTE — Lactation Note (Signed)
This note was copied from a baby's chart. Lactation Consultation Note  Patient Name: Melody Williams [id number] Date: 05/12/2018 Reason for consult: Initial assessment P3, 6 hour female infant. Per mom, she did not breastfeed her twins they were premature and in NICU. Per mom, she receives  [facility] in [employer] and has a breastfeeding International aid/development worker.  Per mom, she has a manual breast pump at home. LC entered room infant was cuing to breastfeed. Mom able demonstrate hand  expression and infant was given 1 ml of colostrum by spoon.  Mom latched infant to left breast using cross -cradle hold, LC ask mom to wait until infant mouth wide with tongue down, lower jaw extended downward.  Infant had wide mouth gape and swallows observed by LC, infant breastfeeding for 10 minutes and was still breastfeeding when LC left room. Per mom, she felt tug but not pain nor pinching. LC discussed importance of STS. Mom knows to breastfeed by hunger cues, 8 to 12 times within 24 hours including nights and not to exceed 3 hours without breastfeeding infant.  LC discussed I & O. Reviewed Baby & Me book's Breastfeeding Basics.  Mom made aware of O/P services, breastfeeding support groups, community resources, and our phone # for post-discharge questions.   Maternal Data Formula Feeding for Exclusion: No Has patient been taught Hand Expression?: Yes(Mom hand expressed 1 ml of colostrum that was spoon feed to infant.) Does the patient have breastfeeding experience prior to this delivery?: No  Feeding Feeding Type: Breast Fed  LATCH Score Latch: Grasps breast easily, tongue down, lips flanged, rhythmical sucking.  Audible Swallowing: Spontaneous and intermittent  Type of Nipple: Everted at rest and after stimulation  Comfort (Breast/Nipple): Soft / non-tender  Hold (Positioning): Assistance needed to correctly position infant at breast and maintain latch.  LATCH Score: 9  Interventions Interventions: Breast  feeding basics reviewed;Assisted with latch;Skin to skin;Breast massage;Breast compression;Hand express  Lactation Tools Discussed/Used WIC Program: Yes   Consult Status Consult Status: Follow-up Date: 05/13/18 Follow-up type: In-patient    Danelle Earthly 05/12/2018, 9:55 PM

## 2018-05-12 NOTE — Anesthesia Procedure Notes (Signed)
Epidural Patient location during procedure: OB Start time: 05/12/2018 2:17 AM End time: 05/12/2018 2:30 AM  Staffing Anesthesiologist: Lowella Curb, MD Performed: anesthesiologist   Preanesthetic Checklist Completed: patient identified, site marked, surgical consent, pre-op evaluation, timeout performed, IV checked, risks and benefits discussed and monitors and equipment checked  Epidural Patient position: sitting Prep: ChloraPrep Patient monitoring: heart rate, cardiac monitor, continuous pulse ox and blood pressure Approach: midline Location: L2-L3 Injection technique: LOR saline  Needle:  Needle type: Tuohy  Needle gauge: 17 G Needle length: 9 cm Needle insertion depth: 5 cm Catheter type: closed end flexible Catheter size: 20 Guage Catheter at skin depth: 9 cm Test dose: negative  Assessment Events: blood not aspirated, injection not painful, no injection resistance, negative IV test and no paresthesia  Additional Notes Reason for block:procedure for pain

## 2018-05-12 NOTE — H&P (Signed)
LABOR AND DELIVERY ADMISSION HISTORY AND PHYSICAL NOTE  Melody Williams is a 28 y.o. female G10P0102 with IUP at [redacted]w[redacted]d by 22wk [place] presenting to MAU for contractions every 5 minutes. On assessment recurrent variables noted with small amount of cervical change. Admitted to L&D due to postdates pregnancy and recurrent variable decelerations. She reports positive fetal movement. She denies leakage of fluid or vaginal bleeding.  Prenatal History/Complications: PNC at [facility] Pregnancy complications:  - Previous cesarean delivery, plans TOLAC  - Depression, plans to start on Celexa  - Post dates pregnancy   Past Medical History: Past Medical History:  Diagnosis Date  . Acute meniscal tear of knee LEFT  . Anxiety   . Eczema   . Eczema     Past Surgical History: Past Surgical History:  Procedure Laterality Date  . CESAREAN SECTION N/A 08/05/2012   Procedure: CESAREAN SECTION;  Surgeon: Adam Phenix, MD;  Location: [facility];  Service: Obstetrics;  Laterality: N/A;  Twins  . KNEE ARTHROSCOPY  05/01/2011   Procedure: ARTHROSCOPY KNEE;  Surgeon: Javier Docker;  Location: [facility];  Service: Orthopedics;  Laterality: Left;  left knee arthroscopy with debridement ,removal of loose  bodies    Obstetrical History: OB History    Gravida  2   Para  1   Term  0   Preterm  1   AB  0   Living  2     SAB  0   TAB  0   Ectopic  0   Multiple  1   Live Births  2           Social History: Social History   Socioeconomic History  . Marital status: Single    Spouse name: Not on file  . Number of children: Not on file  . Years of education: Not on file  . Highest education level: Not on file  Occupational History  . Not on file  Social Needs  . Financial resource strain: Somewhat hard  . Food insecurity:    Worry: Never true    Inability: Never true  . Transportation needs:    Medical: Yes    Non-medical: No  Tobacco Use  . Smoking status: Never Smoker   . Smokeless tobacco: Never Used  Substance and Sexual Activity  . Alcohol use: No  . Drug use: No  . Sexual activity: Yes    Birth control/protection: None    Comment: pregnant   Lifestyle  . Physical activity:    Days per week: Not on file    Minutes per session: Not on file  . Stress: Not on file  Relationships  . Social connections:    Talks on phone: Not on file    Gets together: Not on file    Attends religious service: Not on file    Active member of club or organization: Not on file    Attends meetings of clubs or organizations: Not on file    Relationship status: Not on file  Other Topics Concern  . Not on file  Social History Narrative  . Not on file    Family History: Family History  Problem Relation Age of Onset  . Healthy Mother   . Healthy Father     Allergies: Allergies  Allergen Reactions  . Citrus Rash    Watermelon  . Peanut-Containing Drug Products Itching    MOUTH W/ SEVER ITCHING    Medications Prior to Admission  Medication Sig Dispense Refill  Last Dose  . calcium carbonate (TUMS - DOSED IN MG ELEMENTAL CALCIUM) 500 MG chewable tablet Chew 1-2 tablets by mouth daily.   05/10/2018 at Unknown time  . Elastic Bandages & Supports (COMFORT FIT MATERNITY SUPP SM) MISC Wear as directed. 1 each 0 Taking  . Prenat w/o A Vit-FeFum-FePo-FA (CONCEPT OB) 130-92.4-1 MG CAPS Take 1 capsule by mouth daily. 180 capsule 3 Past Week at Unknown time     Review of Systems  All systems reviewed and negative except as stated in HPI  Physical Exam Blood pressure (!) 112/57, pulse 74, temperature 98.3 F (36.8 C), temperature source Oral, resp. rate 18, last menstrual period 06/24/2017. General appearance: alert, cooperative and mild distress Lungs: clear to auscultation bilaterally Heart: regular rate and rhythm Abdomen: soft, non-tender; bowel sounds normal Extremities: No calf swelling or tenderness Presentation: cephalic Fetal monitoring: 140/ moderate  variability/ +accels/ variable decerlations  Uterine activity: 2-4/ moderate by palpation  Dilation: 3 Effacement (%): 80 Station: -2 Exam by:: TLYTLE RN   Prenatal labs: ABO, Rh: O/Positive/-- (08/27 1453) Antibody: Negative (08/27 1453) Rubella: 1.60 (08/27 1453) RPR: Non Reactive (10/18 1010)  HBsAg: Negative (08/27 1453)  HIV: Non Reactive (10/18 1010)  GC/Chlamydia: Negative  GBS: Positive (12/05 1440)  2 hr Glucola: 70-82-59 Genetic screening:  normal Anatomy US: normal   Nursing Staff Provider  Office Location  [facility] Dating  22 wk u/s  Language  English Anatomy US  WNL but incomplete-f/u 3 wks  Flu Vaccine  Declined 02/24/18 Genetic Screen  NIPS: low risk female  AFP:      TDaP vaccine   declined 02/24/18 Hgb A1C or  GTT Early  Third trimester 70/82/59  Rhogam     LAB RESULTS   Feeding Plan  breastfeed Blood Type O/Positive/-- (08/27 1453)   Contraception  DEPO Antibody Negative (08/27 1453)  Circumcision  yes Rubella 1.60 (08/27 1453)  Pediatrician   [facility] RPR Non Reactive (10/18 1010)   Support Person  Husband & Mother HBsAg Negative (08/27 1453)   Prenatal Classes  no HIV Non Reactive (10/18 1010)  BTL Consent  GBS Positive (12/05 1440)(For PCN allergy, check sensitivities)   VBAC Consent  Pap WNL    Hgb Electro  Nml AA    CF Neg 97    SMA 3 copies    Waterbirth  [ ]  Class [ ]  Consent [ ]  CNM visit   Prenatal Transfer Tool  Maternal Diabetes: No Genetic Screening: Normal Maternal Ultrasounds/Referrals: Normal Fetal Ultrasounds or other Referrals:  None Maternal Substance Abuse:  No Significant Maternal Medications:  Meds include: Other: Celexa  Significant Maternal Lab Results: Lab values include: Group B Strep positive  No results found for this or any previous visit (from the past 24 hour(s)).  Patient Active Problem List   Diagnosis Date Noted  . Normal labor 05/12/2018  . Post term pregnancy at [redacted] weeks gestation 05/12/2018  .  History of prior pregnancy with IUGR newborn 01/17/2018  . Supervision of other normal pregnancy, antepartum 01/04/2018  . Previous cesarean delivery affecting pregnancy, antepartum 01/04/2018  . Depression 01/01/2014    Assessment: Garyn Bolla is a 28 y.o. G2P0102 at [redacted]w[redacted]d here for IOL for recurrent variable decelerations and postdates. TOLAC.   #Labor: IOL with pitocin after patient comfortable with epidural. #Pain: Plans epidural  #FWB: Cat II  #ID:  GBS pos  #MOF: Breast #MOC: Depo #Circ:  Outpatient   Sharyon Cable, CNM 05/12/2018, 1:51 AM

## 2018-05-12 NOTE — Progress Notes (Signed)
Called to bedside by RN for prolonged deceleration   Vitals:   05/12/18 0618 05/12/18 0638 05/12/18 0700 05/12/18 0705  BP: (!) 90/46 98/67    Pulse: 62 75    Resp: 18 16    Temp: 97.7 F (36.5 C)     TempSrc: Oral     SpO2:   97% 98%  Weight:      Height:       SROM @0650 - clear fluid, prior to deceleration   FSE and IUPC placed  Dilation: 5.5 Effacement (%): 90 Station: -1 Presentation: Vertex Exam by:: Francee Setzer CNM  FHR: 115/ moderate/ +accel/ prolonged deceleration for 5 minutes- resolved with position changes and pitocin cut off   Plans VBAC delivery   Sharyon Cable, CNM 05/12/18

## 2018-05-13 ENCOUNTER — Encounter (HOSPITAL_COMMUNITY): Payer: Self-pay

## 2018-05-13 LAB — RPR: RPR: NONREACTIVE

## 2018-05-13 NOTE — Progress Notes (Signed)
MOB was referred for history of depression/anxiety. * Referral screened out by Clinical Social Worker because none of the following criteria appear to apply: ~ History of anxiety/depression during this pregnancy, or of post-partum depression following prior delivery. ~ Diagnosis of anxiety and/or depression within last 3 years OR * MOB's symptoms currently being treated with medication and/or therapy. Please contact the Clinical Social Worker if needs arise, by MOB request, or if MOB scores greater than 9/yes to question 10 on Edinburgh Postpartum Depression Screen.  Vinod Mikesell Boyd-Gilyard, MSW, LCSW Clinical Social Work [phone]  

## 2018-05-13 NOTE — Progress Notes (Signed)
Post Partum Day #1 Subjective: no complaints, up ad lib and tolerating PO; breastfeeding going slowly; desires DMPA for contraception  Objective: Blood pressure 111/68, pulse 63, temperature 98.3 F (36.8 C), temperature source Oral, resp. rate 16, height 5\' 4"  (1.626 m), weight 85.7 kg, last menstrual period 06/24/2017, SpO2 100 %, unknown if currently breastfeeding.  Physical Exam:  General: alert, cooperative and no distress Lochia: appropriate Uterine Fundus: firm DVT Evaluation: No evidence of DVT seen on physical exam.  Recent Labs    05/12/18 0133  HGB 12.4  HCT 36.7    Assessment/Plan: Plan for discharge tomorrow and Lactation consult   LOS: 1 day   Arabella Merles CNM 05/13/2018, 12:17 PM

## 2018-05-13 NOTE — Anesthesia Postprocedure Evaluation (Signed)
Anesthesia Post Note  Patient: Geniya Coltrain  Procedure(s) Performed: AN AD HOC LABOR EPIDURAL     Patient location during evaluation: Mother Baby Anesthesia Type: Epidural Level of consciousness: awake and alert Pain management: pain level controlled Vital Signs Assessment: post-procedure vital signs reviewed and stable Respiratory status: spontaneous breathing Cardiovascular status: stable Postop Assessment: no headache, patient able to bend at knees, no backache, no apparent nausea or vomiting, epidural receding, adequate PO intake and able to ambulate Anesthetic complications: no    Last Vitals:  Vitals:   05/13/18 0251 05/13/18 0554  BP: 109/64 105/69  Pulse: 61 (!) 47  Resp: 18 16  Temp: 36.8 C 36.8 C  SpO2: 100% 100%    Last Pain:  Vitals:   05/13/18 0553  TempSrc:   PainSc: 0-No pain   Pain Goal:                 Salome Arnt

## 2018-05-13 NOTE — Lactation Note (Signed)
This note was copied from a baby's chart. Lactation Consultation Note  Patient Name: Melody Williams FXJOI'[id number] Date: 05/13/2018 Reason for consult: Follow-up assessment;Infant weight loss;1st time breastfeeding  38 hours old FT female who is being exclusively BF by his mother, she's a P2 (has twins); baby is at 1% weight loss. Per mom BF is going well and she's able to latch baby to the breast without assistance, mom is also using her Lansinoh hand pump from home and our hospital DEBP to express some breastmilk. Mom told LC that she's getting about 5 ml per pumping session, but she's only pumping one side at a time, even with the DEBP. Advised mom to try double pumping instead and explained how that can increase her milk yield.   Mom has been spoon and cup (from DEBP kit container) feeding baby her EBM and baby has been taking it without any problems. Praised her for her efforts. Noticed that junctures in her DEBP were loose and adjusted them. Let mom know that she'll feel a difference in the suction level the next time she pumps. Discussed cluster feeding and normal newborn behavior. Baby was asleep in his bassinet when entering the room; mom did no wish to disturb baby's sleep at this time since he already fed. Asked mom to call for assistance whenever is needed.  Feeding plan:  1. Encouraged mom to feed baby 8-12 times/24 hours or sooner if feeding cues are present. 2. Mom will continue pumping PRN after feedings and keep spoon feeeding any amount of EBM she may get to baby   Parents reported all questions and concerns were answered, they're both aware of [place] services and will call PRN.  Maternal Data    Feeding Feeding Type: Breast Fed  Interventions Interventions: Breast feeding basics reviewed;DEBP  Lactation Tools Discussed/Used Tools: Pump Breast pump type: Double-Electric Breast Pump Pump Review: Setup, frequency, and cleaning Initiated by:: RN and MPeck (adjusted junctures in  pump, they're loose) Date initiated:: 05/12/18   Consult Status Consult Status: PRN Date: 05/14/18 Follow-up type: In-patient    [place] 05/13/2018, 10:48 PM

## 2018-05-14 ENCOUNTER — Inpatient Hospital Stay (HOSPITAL_COMMUNITY)
Admit: 2018-05-14 | Discharge: 2018-05-14 | Disposition: A | Discharge status: Home / Self Care | Payer: Medicaid Other | Attending: Obstetrics & Gynecology | Admitting: Obstetrics & Gynecology

## 2018-05-14 MED ORDER — IBUPROFEN 600 MG PO TABS: 600.0000 mg | ORAL_TABLET | Freq: Four times a day (QID) | ORAL | 0 refills | Status: DC

## 2018-05-14 NOTE — Discharge Summary (Signed)
Physician Discharge Summary  Patient ID: Melody Williams MRN: [id number] DOB/AGE: 06-May-1991 27 y.o.  Admit date: 05/11/2018 Discharge date: 05/14/2018  Admission Diagnoses: Normal labor  Discharge Diagnoses:  Active Problems:   Normal labor   Post-dates pregnancy   Discharged Condition: good  Hospital Course: 28 year old [id number] who presented for IOL for recurrent variable decels and post-dates. Patient had a history of C-Section for twins and underwent TOLAC. Patient initially started on pitocin which was titrated until patient had prolonged decels. Patient underwent VBAC with success. Patient had normal post-delivery course. Prior to discharge she was eating, walking, and performing ADLs with minimal cramping pain and lochia.  Consults: NICU  Significant Diagnostic Studies: none  Treatments: VBAC  Discharge Exam: Blood pressure (!) 107/55, pulse (!) 56, temperature 98.3 F (36.8 C), temperature source Oral, resp. rate 17, height 5\' 4"  (1.626 m), weight 85.7 kg, last menstrual period 06/24/2017, SpO2 100 %, unknown if currently breastfeeding. General appearance: alert and cooperative Head: Normocephalic, without obvious abnormality, atraumatic Eyes: conjunctivae/corneas clear. PERRL, EOM's intact. Fundi benign. Resp: clear to auscultation bilaterally and normal percussion bilaterally Cardio: regular rate and rhythm, S1, S2 normal, no murmur, click, rub or gallop and normal apical impulse GI: soft, non-tender; bowel sounds normal; no masses,  no organomegaly  Disposition:  Discharge disposition: 01-Home or Self Care       Discharge Instructions    Call MD for:  difficulty breathing, headache or visual disturbances   Complete by:  As directed    Call MD for:  persistant nausea and vomiting   Complete by:  As directed    Call MD for:  redness, tenderness, or signs of infection (pain, swelling, redness, odor or green/yellow discharge around incision site)   Complete by:  As  directed    Call MD for:  severe uncontrolled pain   Complete by:  As directed    Call MD for:  temperature >100.4   Complete by:  As directed    Diet - low sodium heart healthy   Complete by:  As directed    Diet - low sodium heart healthy   Complete by:  As directed    Increase activity slowly   Complete by:  As directed    Increase activity slowly   Complete by:  As directed      Allergies as of 05/14/2018      Reactions   Citrus Rash   Watermelon   Peanut-containing Drug Products Itching   MOUTH W/ SEVER ITCHING      Medication List    STOP taking these medications   COMFORT FIT MATERNITY SUPP SM Misc   CONCEPT OB 130-92.4-1 MG Caps     TAKE these medications   calcium carbonate 500 MG chewable tablet Commonly known as:  TUMS - dosed in mg elemental calcium Chew 1-2 tablets by mouth daily.   ibuprofen 600 MG tablet Commonly known as:  ADVIL,MOTRIN Take 1 tablet (600 mg total) by mouth every 6 (six) hours.        Signed: Myrene Buddy 05/14/2018, 7:48 AM

## 2018-05-14 NOTE — Lactation Note (Signed)
This note was copied from a baby's chart. Lactation Consultation Note  Patient Name: Melody Williams DHWYS'H Date: 05/14/2018 Reason for consult: Follow-up assessment;Term;Infant weight loss;1st time breastfeeding;Other (Comment)(per  mom pumped and bottle fed 1st babies - twins)  Per mom working on the latching and plan to breast feed and bottle feed my milk.  LC reviewed the importance of the 1st 2 weeks of breast feeding and giving the baby practice  Latching so the baby can help establish the her milk supply.  LC recommended if she is going to do both - allow the baby to breast feed 1st / either latch 2nd breast  Of supplement with the bottle EBM 30 ml.  Mom mentioned the baby doesn't open wide/ then she realized when she feeds the bottle she allow the  Baby to nibble on to the bottle so the baby is doing the same at the breast.  LC reviewed PACE feeding and importance of sitting the baby up and tickling the upper lip until the He opens wide and making sure the lips are flanged.  LC mentioned to mom the baby will transition back and forth well.  Sore nipple and engorgement prevention and tx reviewed.  Per mom was given a hand pump and has DEBP Lansinoh.  Per mom active with WIC / [place] - LC reminded mom WIC is a resource.  Mother informed of post-discharge support and given phone number to the lactation department, including services for phone call assistance; out-patient appointments; and breastfeeding support group. List of other breastfeeding resources in the community given in the handout. Encouraged mother to call for problems or concerns related to breastfeeding.   Maternal Data Has patient been taught Hand Expression?: Yes  Feeding Feeding Type: (baby not showing signs of hunger )  LATCH Score                   Interventions Interventions: Breast feeding basics reviewed  Lactation Tools Discussed/Used WIC Program: Yes Pump Review: Milk Storage   Consult  Status Consult Status: Complete Date: 05/14/18    Kathrin Greathouse 05/14/2018, 10:44 AM

## 2018-05-14 NOTE — Discharge Summary (Signed)
OB Discharge Summary     Patient Name: Melody ProvidenceLatisia Williams DOB: Jun 02, 1990 MRN: [id number]  Date of admission: 05/11/2018 Delivering MD: Willodean RosenthalHARRAWAY-SMITH, CAROLYN   Date of discharge: 05/14/2018  Admitting diagnosis: 40WKS CTX Intrauterine pregnancy: 8242w5d     Secondary diagnosis:  Active Problems:   Previous cesarean delivery affecting pregnancy, antepartum   History of prior pregnancy with IUGR newborn   Post-dates pregnancy  Additional problems: GBS positive     Discharge diagnosis: Term Pregnancy Delivered and VBAC                                                                                                Post partum procedures:none  Augmentation: Pitocin  Complications: None  Hospital course:  Induction of Labor With Vaginal Delivery   28 y.o. yo [id number] at 7142w5d was admitted to the hospital 05/11/2018 for induction of labor.  Indication for induction: postdates and recurrent FHR variables.  Patient had a labor course remarkable for TOLAC; she was started on Pit after admission as her cx was already favorable. She became complete and had some FHR changes requiring operative delivery, and pt consented to VAVD (see Delivery Summary): Membrane Rupture Time/Date: 6:40 AM ,05/12/2018   Intrapartum Procedures: Episiotomy: None [1]                                         Lacerations:  2nd degree [3];Periurethral [8]  Patient had delivery of a Viable infant.  Information for the patient's newborn:  Melody Williams, Melody Williams [[id number]][[id number]]  Delivery Method: VBAC, Vacuum   05/12/2018  Details of delivery can be found in separate delivery note.  Patient had a routine postpartum course. Patient is discharged home 05/14/18.  Physical exam  Vitals:   05/13/18 1207 05/13/18 1348 05/13/18 2129 05/14/18 0545  BP: 111/68 117/68 118/66 (!) 107/55  Pulse: 63 67 65 (!) 56  Resp:  18  17  Temp: 98.3 F (36.8 C) 98.1 F (36.7 C)  98.3 F (36.8 C)  TempSrc: Oral   Oral  SpO2: 100%   100%  Weight:       Height:       General: alert and cooperative Lochia: appropriate Uterine Fundus: firm Incision: N/A DVT Evaluation: No evidence of DVT seen on physical exam. Labs: Lab Results  Component Value Date   WBC 13.3 (H) 05/12/2018   HGB 12.4 05/12/2018   HCT 36.7 05/12/2018   MCV 96.1 05/12/2018   PLT 232 05/12/2018   CMP Latest Ref Rng & Units 12/14/2015  Glucose 65 - 99 mg/dL 94  BUN 6 - 20 mg/dL 8  Creatinine 1.300.44 - 8.651.00 mg/dL 7.840.87  Sodium 696135 - 295145 mmol/L 140  Potassium 3.5 - 5.1 mmol/L 3.6  Chloride 101 - 111 mmol/L 107  CO2 22 - 32 mmol/L 26  Calcium 8.9 - 10.3 mg/dL 9.7  Total Protein 6.5 - 8.1 g/dL 8.1  Total Bilirubin 0.3 - 1.2 mg/dL 0.7  Alkaline Phos 38 - 126 U/L 48  AST 15 -  41 U/L 24  ALT 14 - 54 U/L 25    Discharge instruction: per After Visit Summary and "Baby and Me Booklet".  After visit meds:  Allergies as of 05/14/2018      Reactions   Citrus Rash   Watermelon   Peanut-containing Drug Products Itching   MOUTH W/ SEVER ITCHING      Medication List    STOP taking these medications   COMFORT FIT MATERNITY SUPP SM Misc   CONCEPT OB 130-92.4-1 MG Caps     TAKE these medications   calcium carbonate 500 MG chewable tablet Commonly known as:  TUMS - dosed in mg elemental calcium Chew 1-2 tablets by mouth daily.   ibuprofen 600 MG tablet Commonly known as:  ADVIL,MOTRIN Take 1 tablet (600 mg total) by mouth every 6 (six) hours.       Diet: routine diet  Activity: Advance as tolerated. Pelvic rest for 6 weeks.   Outpatient follow up:4 weeks Follow up Appt:No future appointments. Follow up Visit:No follow-ups on file.  Postpartum contraception: Depo Provera  Newborn Data: Live born female  Birth Weight: 7 lb 10.8 oz (3480 g) APGAR: 6, 9  Newborn Delivery   Birth date/time:  05/12/2018 15:55:00 Delivery type:  VBAC, Vacuum Assisted     Baby Feeding: Breast Disposition:home with mother   05/14/2018 Arabella Merles, CNM  9:50 AM

## 2018-06-09 ENCOUNTER — Ambulatory Visit: Payer: Medicaid Other | Admitting: Obstetrics & Gynecology

## 2018-06-10 ENCOUNTER — Encounter: Payer: Self-pay | Admitting: Obstetrics & Gynecology

## 2018-06-10 ENCOUNTER — Other Ambulatory Visit (HOSPITAL_COMMUNITY)
Admission: RE | Admit: 2018-06-10 | Discharge: 2018-06-10 | Disposition: A | Discharge status: Home / Self Care | Payer: Medicaid Other | Source: Ambulatory Visit | Attending: Obstetrics & Gynecology | Admitting: Obstetrics & Gynecology

## 2018-06-10 ENCOUNTER — Ambulatory Visit (INDEPENDENT_AMBULATORY_CARE_PROVIDER_SITE_OTHER): Payer: Medicaid Other | Admitting: Obstetrics & Gynecology

## 2018-06-10 DIAGNOSIS — Z3042 Encounter for surveillance of injectable contraceptive: Secondary | ICD-10-CM

## 2018-06-10 DIAGNOSIS — N898 Other specified noninflammatory disorders of vagina: Secondary | ICD-10-CM

## 2018-06-10 DIAGNOSIS — Z1389 Encounter for screening for other disorder: Secondary | ICD-10-CM

## 2018-06-10 MED ORDER — MEDROXYPROGESTERONE ACETATE 150 MG/ML IM SUSP
150.0000 mg | Freq: Once | INTRAMUSCULAR | Status: AC
  Administered 2018-06-10: 150 mg via INTRAMUSCULAR

## 2018-06-10 NOTE — Addendum Note (Signed)
Addended by: Natale Milch D on: 06/10/2018 12:25 PM   Modules accepted: Orders

## 2018-06-10 NOTE — Patient Instructions (Signed)
Laparoscopic Tubal Ligation Laparoscopic tubal ligation is a procedure to close the fallopian tubes. This is done so that you cannot get pregnant. When the fallopian tubes are closed, the eggs that your ovaries release cannot enter the uterus, and sperm cannot reach the released eggs. A laparoscopic tubal ligation is sometimes called "getting your tubes tied." You should not have this procedure if you want to get pregnant someday or if you are unsure about having more children. Tell a health care provider about:  Any allergies you have.  All medicines you are taking, including vitamins, herbs, eye drops, creams, and over-the-counter medicines.  Any problems you or family members have had with anesthetic medicines.  Any blood disorders you have.  Any surgeries you have had.  Any medical conditions you have.  Whether you are pregnant or may be pregnant.  Any past pregnancies. What are the risks? Generally, this is a safe procedure. However, problems may occur, including:  Infection.  Bleeding.  Injury to surrounding organs.  Side effects from anesthetics.  Failure of the procedure. This procedure can increase your risk of a kind of pregnancy in which a fertilized egg attaches to the outside of the uterus (ectopic pregnancy). What happens before the procedure?  Ask your health care provider about: ? Changing or stopping your regular medicines. This is especially important if you are taking diabetes medicines or blood thinners. ? Taking medicines such as aspirin and ibuprofen. These medicines can thin your blood. Do not take these medicines before your procedure if your health care provider instructs you not to.  Follow instructions from your health care provider about eating and drinking restrictions.  Plan to have someone take you home after the procedure.  If you go home right after the procedure, plan to have someone with you for 24 hours. What happens during the  procedure?      You will be given one or more of the following: ? A medicine to help you relax (sedative). ? A medicine to numb the area (local anesthetic). ? A medicine to make you fall asleep (general anesthetic). ? A medicine that is injected into an area of your body to numb everything below the injection site (regional anesthetic).  An IV tube will be inserted into one of your veins. It will be used to give you medicines and fluids during the procedure.  Your bladder may be emptied with a small tube (catheter).  If you have been given a general anesthetic, a tube will be put down your throat to help you breathe.  Two small cuts (incisions) will be made in your lower abdomen and near your belly button.  Your abdomen will be inflated with a gas. This will let the surgeon see better and will give the surgeon room to work.  A thin, lighted tube (laparoscope) with a camera attached will be inserted into your abdomen through one of the incisions. Small instruments will be inserted through the other incision.  The fallopian tubes will be tied off, burned (cauterized), or blocked with a clip, ring, or clamp. A small portion in the center of each fallopian tube may be removed.  The gas will be released from the abdomen.  The incisions will be closed with stitches (sutures).  A bandage (dressing) will be placed over the incisions. The procedure may vary among health care providers and hospitals. What happens after the procedure?  Your blood pressure, heart rate, breathing rate, and blood oxygen level will be monitored often until   the medicines you were given have worn off.  You will be given medicine to help with pain, nausea, and vomiting as needed. This information is not intended to replace advice given to you by your health care provider. Make sure you discuss any questions you have with your health care provider. Document Released: 08/03/2000 Document Revised: 12/22/2016 Document  Reviewed: 04/07/2015 Elsevier Interactive Patient Education  2019 [employer] Medroxyprogesterone injection [Contraceptive] What is this medicine? MEDROXYPROGESTERONE (me DROX ee proe JES te rone) contraceptive injections prevent pregnancy. They provide effective birth control for 3 months. Depo-subQ Provera 104 is also used for treating pain related to endometriosis. This medicine may be used for other purposes; ask your health care provider or pharmacist if you have questions. COMMON BRAND NAME(S): Depo-Provera, Depo-subQ Provera 104 What should I tell my health care provider before I take this medicine? They need to know if you have any of these conditions: -frequently drink alcohol -asthma -blood vessel disease or a history of a blood clot in the lungs or legs -bone disease such as osteoporosis -breast cancer -diabetes -eating disorder (anorexia nervosa or bulimia) -high blood pressure -HIV infection or AIDS -kidney disease -liver disease -mental depression -migraine -seizures (convulsions) -stroke -tobacco smoker -vaginal bleeding -an unusual or allergic reaction to medroxyprogesterone, other hormones, medicines, foods, dyes, or preservatives -pregnant or trying to get pregnant -breast-feeding How should I use this medicine? Depo-Provera Contraceptive injection is given into a muscle. Depo-subQ Provera 104 injection is given under the skin. These injections are given by a health care professional. You must not be pregnant before getting an injection. The injection is usually given during the first 5 days after the start of a menstrual period or 6 weeks after delivery of a baby. Talk to your pediatrician regarding the use of this medicine in children. Special care may be needed. These injections have been used in female children who have started having menstrual periods. Overdosage: If you think you have taken too much of this medicine contact a poison control center or  emergency room at once. NOTE: This medicine is only for you. Do not share this medicine with others. What if I miss a dose? Try not to miss a dose. You must get an injection once every 3 months to maintain birth control. If you cannot keep an appointment, call and reschedule it. If you wait longer than 13 weeks between Depo-Provera contraceptive injections or longer than 14 weeks between Depo-subQ Provera 104 injections, you could get pregnant. Use another method for birth control if you miss your appointment. You may also need a pregnancy test before receiving another injection. What may interact with this medicine? Do not take this medicine with any of the following medications: -bosentan This medicine may also interact with the following medications: -aminoglutethimide -antibiotics or medicines for infections, especially rifampin, rifabutin, rifapentine, and griseofulvin -aprepitant -barbiturate medicines such as phenobarbital or primidone -bexarotene -carbamazepine -medicines for seizures like ethotoin, felbamate, oxcarbazepine, phenytoin, topiramate -modafinil -St. John's wort This list may not describe all possible interactions. Give your health care provider a list of all the medicines, herbs, non-prescription drugs, or dietary supplements you use. Also tell them if you smoke, drink alcohol, or use illegal drugs. Some items may interact with your medicine. What should I watch for while using this medicine? This drug does not protect you against HIV infection (AIDS) or other sexually transmitted diseases. Use of this product may cause you to lose calcium from your bones. Loss of calcium may cause  weak bones (osteoporosis). Only use this product for more than 2 years if other forms of birth control are not right for you. The longer you use this product for birth control the more likely you will be at risk for weak bones. Ask your health care professional how you can keep strong bones. You  may have a change in bleeding pattern or irregular periods. Many females stop having periods while taking this drug. If you have received your injections on time, your chance of being pregnant is very low. If you think you may be pregnant, see your health care professional as soon as possible. Tell your health care professional if you want to get pregnant within the next year. The effect of this medicine may last a long time after you get your last injection. What side effects may I notice from receiving this medicine? Side effects that you should report to your doctor or health care professional as soon as possible: -allergic reactions like skin rash, itching or hives, swelling of the face, lips, or tongue -breast tenderness or discharge -breathing problems -changes in vision -depression -feeling faint or lightheaded, falls -fever -pain in the abdomen, chest, groin, or leg -problems with balance, talking, walking -unusually weak or tired -yellowing of the eyes or skin Side effects that usually do not require medical attention (report to your doctor or health care professional if they continue or are bothersome): -acne -fluid retention and swelling -headache -irregular periods, spotting, or absent periods -temporary pain, itching, or skin reaction at site where injected -weight gain This list may not describe all possible side effects. Call your doctor for medical advice about side effects. You may report side effects to FDA at 1-800-FDA-1088. Where should I keep my medicine? This does not apply. The injection will be given to you by a health care professional. NOTE: This sheet is a summary. It may not cover all possible information. If you have questions about this medicine, talk to your doctor, pharmacist, or health care provider.  2019 Elsevier/Gold Standard (2008-05-18 18:37:56)

## 2018-06-10 NOTE — Progress Notes (Signed)
Post Partum Exam  Melody Williams is a 28 y.o. G29P1103 female who presents for a postpartum visit. She is 4 weeks postpartum following a spontaneous vaginal delivery. I have fully reviewed the prenatal and intrapartum course. The delivery was at [redacted]w[redacted]d gestational weeks.  Anesthesia: epidural. Postpartum course has been unremarkeable. Baby's course has been unremarkable. Baby is feeding by breast. Bleeding spotting. Bowel function is normal. Bladder function is normal. Patient is not sexually active. Contraception method is none. Postpartum depression screening:neg.She is not taking Celexa that was prescribed  The following portions of the patient's history were reviewed and updated as appropriate: allergies, current medications, past family history, past medical history, past social history, past surgical history and problem list. Last pap smear done 12/2017 and was Normal  Review of Systems   irritation and discharge at laceration site  Objective:  Last menstrual period 06/24/2017, unknown if currently breastfeeding.  General:  alert, cooperative and no distress   Breasts:    Lungs:   Heart:    Abdomen: soft, non-tender; bowel sounds normal; no masses,  no organomegaly   Vulva:  perineal laceration and vaginal lac repair inspected and apper normal  Vagina: discharge                    Assessment:    normal postpartum exam. Pap smear not done at today's visit.   Plan:   1. Contraception: Depo-Provera injections 2. Wants to schedule LBTL. 3. Follow up  as needed.  4.28 y.o. [id number] with undesired fertility desires permanent sterilization. Risks and benefits of laparoscopic tubal sterilization procedure was discussed with the patient including permanence of method, bleeding, infection, injury to surrounding organs, post tubal pain or menstrual problem, anesthesia and need for additional procedures. Risk failure of 0.5-1% with increased risk of ectopic gestation if pregnancy occurs was  also discussed with patient. Patient verbalized understanding and all questions were answered.  Melody Williams Melody Loop MD 06/10/2018

## 2018-06-13 ENCOUNTER — Other Ambulatory Visit: Payer: Self-pay | Admitting: Obstetrics & Gynecology

## 2018-06-13 ENCOUNTER — Encounter (HOSPITAL_COMMUNITY): Payer: Self-pay

## 2018-06-13 LAB — CERVICOVAGINAL ANCILLARY ONLY
Bacterial vaginitis: POSITIVE — AB
Candida vaginitis: NEGATIVE
Chlamydia: NEGATIVE
NEISSERIA GONORRHEA: NEGATIVE
Trichomonas: NEGATIVE

## 2018-06-13 NOTE — Progress Notes (Signed)
Order for surgery

## 2018-06-14 ENCOUNTER — Other Ambulatory Visit: Payer: Self-pay | Admitting: Obstetrics & Gynecology

## 2018-06-14 DIAGNOSIS — N898 Other specified noninflammatory disorders of vagina: Secondary | ICD-10-CM

## 2018-06-14 MED ORDER — METRONIDAZOLE 500 MG PO TABS: 500.0000 mg | ORAL_TABLET | Freq: Two times a day (BID) | ORAL | 0 refills | Status: DC

## 2018-06-14 NOTE — Progress Notes (Signed)
Flagyl for BV 

## 2018-07-13 ENCOUNTER — Ambulatory Visit (HOSPITAL_BASED_OUTPATIENT_CLINIC_OR_DEPARTMENT_OTHER): Admit: 2018-07-13 | Payer: Medicaid Other | Admitting: Obstetrics & Gynecology

## 2018-07-13 ENCOUNTER — Encounter (HOSPITAL_BASED_OUTPATIENT_CLINIC_OR_DEPARTMENT_OTHER): Payer: Self-pay

## 2018-07-13 SURGERY — LIGATION, FALLOPIAN TUBE, LAPAROSCOPIC
Anesthesia: Choice | Laterality: Bilateral

## 2018-09-07 ENCOUNTER — Ambulatory Visit: Payer: Medicaid Other

## 2018-09-08 ENCOUNTER — Ambulatory Visit: Payer: Medicaid Other

## 2018-09-14 ENCOUNTER — Other Ambulatory Visit: Payer: Self-pay

## 2018-09-14 ENCOUNTER — Ambulatory Visit (INDEPENDENT_AMBULATORY_CARE_PROVIDER_SITE_OTHER): Payer: Medicaid Other

## 2018-09-14 DIAGNOSIS — Z3042 Encounter for surveillance of injectable contraceptive: Secondary | ICD-10-CM | POA: Diagnosis not present

## 2018-09-14 DIAGNOSIS — Z3202 Encounter for pregnancy test, result negative: Secondary | ICD-10-CM

## 2018-09-14 LAB — POCT URINE PREGNANCY: Preg Test, Ur: NEGATIVE

## 2018-09-14 MED ORDER — MEDROXYPROGESTERONE ACETATE 150 MG/ML IM SUSP
150.0000 mg | INTRAMUSCULAR | Status: DC
  Administered 2018-09-14: 15:00:00 150 mg via INTRAMUSCULAR

## 2018-09-14 MED ORDER — MEDROXYPROGESTERONE ACETATE 150 MG/ML IM SUSP: 150.0000 mg | INTRAMUSCULAR | 3 refills | Status: DC

## 2018-09-14 NOTE — Progress Notes (Signed)
Pt is in the office for depo restart. Per provider, ok to give depo today, UPT is negative; injection administered and pt tolerated well. .. Administrations This Visit    medroxyPROGESTERone (DEPO-PROVERA) injection 150 mg    Admin Date 09/14/2018 Action Given Dose 150 mg Route Intramuscular Administered By Katrina Stack, RN

## 2018-12-05 ENCOUNTER — Ambulatory Visit: Payer: Medicaid Other

## 2019-04-24 ENCOUNTER — Emergency Department (HOSPITAL_COMMUNITY): Payer: Medicaid Other

## 2019-04-24 ENCOUNTER — Encounter (HOSPITAL_COMMUNITY): Payer: Self-pay

## 2019-04-24 ENCOUNTER — Emergency Department (HOSPITAL_COMMUNITY)
Admission: EM | Admit: 2019-04-24 | Discharge: 2019-04-24 | Disposition: A | Discharge status: Home / Self Care | Payer: Medicaid Other | Attending: Emergency Medicine | Admitting: Emergency Medicine

## 2019-04-24 DIAGNOSIS — R0789 Other chest pain: Secondary | ICD-10-CM | POA: Insufficient documentation

## 2019-04-24 DIAGNOSIS — F419 Anxiety disorder, unspecified: Secondary | ICD-10-CM | POA: Diagnosis not present

## 2019-04-24 LAB — BASIC METABOLIC PANEL
Anion gap: 8 (ref 5–15)
BUN: 11 mg/dL (ref 6–20)
CO2: 24 mmol/L (ref 22–32)
Calcium: 8.8 mg/dL — ABNORMAL LOW (ref 8.9–10.3)
Chloride: 106 mmol/L (ref 98–111)
Creatinine, Ser: 0.78 mg/dL (ref 0.44–1.00)
GFR calc Af Amer: >60 mL/min (ref >60)
GFR calc non Af Amer: >60 mL/min (ref >60)
Glucose, Bld: 82 mg/dL (ref 70–99)
Potassium: 3.9 mmol/L (ref 3.5–5.1)
Sodium: 138 mmol/L (ref 135–145)

## 2019-04-24 LAB — I-STAT BETA HCG BLOOD, ED (MC, WL, AP ONLY): I-stat hCG, quantitative: <5 m[IU]/mL (ref <5)

## 2019-04-24 LAB — CBC WITH DIFFERENTIAL/PLATELET
Abs Immature Granulocytes: 0.01 10*3/uL (ref 0.00–0.07)
Basophils Absolute: 0 10*3/uL (ref 0.0–0.1)
Basophils Relative: 0 %
Eosinophils Absolute: 0.1 10*3/uL (ref 0.0–0.5)
Eosinophils Relative: 1 %
HCT: 39 % (ref 36.0–46.0)
Hemoglobin: 12.8 g/dL (ref 12.0–15.0)
Immature Granulocytes: 0 %
Lymphocytes Relative: 35 %
Lymphs Abs: 2.5 10*3/uL (ref 0.7–4.0)
MCH: 30.7 pg (ref 26.0–34.0)
MCHC: 32.8 g/dL (ref 30.0–36.0)
MCV: 93.5 fL (ref 80.0–100.0)
Monocytes Absolute: 0.5 10*3/uL (ref 0.1–1.0)
Monocytes Relative: 7 %
Neutro Abs: 3.9 10*3/uL (ref 1.7–7.7)
Neutrophils Relative %: 57 %
Platelets: 275 10*3/uL (ref 150–400)
RBC: 4.17 MIL/uL (ref 3.87–5.11)
RDW: 11.9 % (ref 11.5–15.5)
WBC: 7 10*3/uL (ref 4.0–10.5)
nRBC: 0 % (ref 0.0–0.2)

## 2019-04-24 LAB — TROPONIN I (HIGH SENSITIVITY): Troponin I (High Sensitivity): <2 ng/L (ref <18)

## 2019-04-24 IMAGING — CR DG CHEST 2V
2 series · 2 of 2 positions shown · non-contrast
Comparison: 01/26/2017

CLINICAL DATA: Shortness of breath, anxiety, chest pain

EXAM:
CHEST - 2 VIEW

[w chest pa]
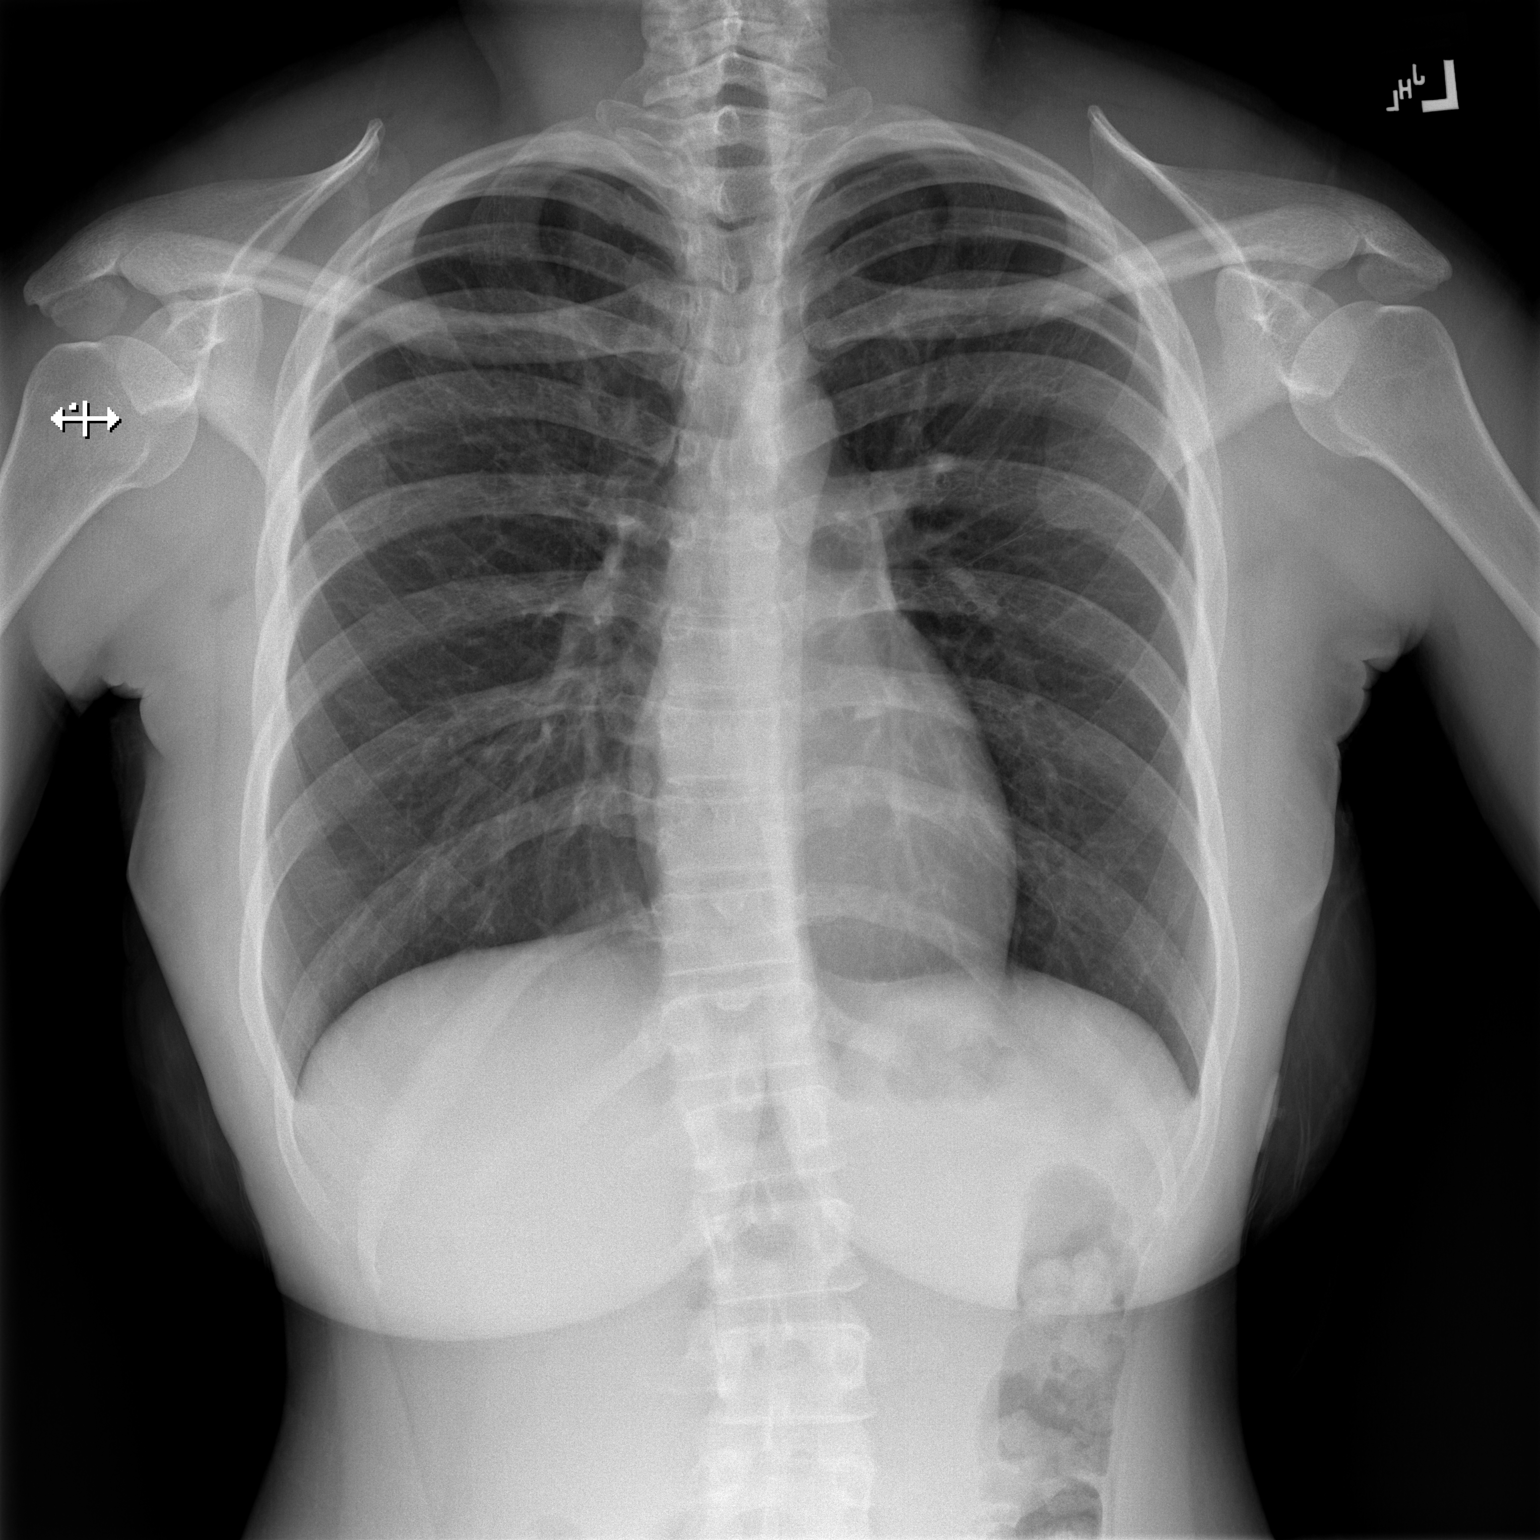

[w chest lat]
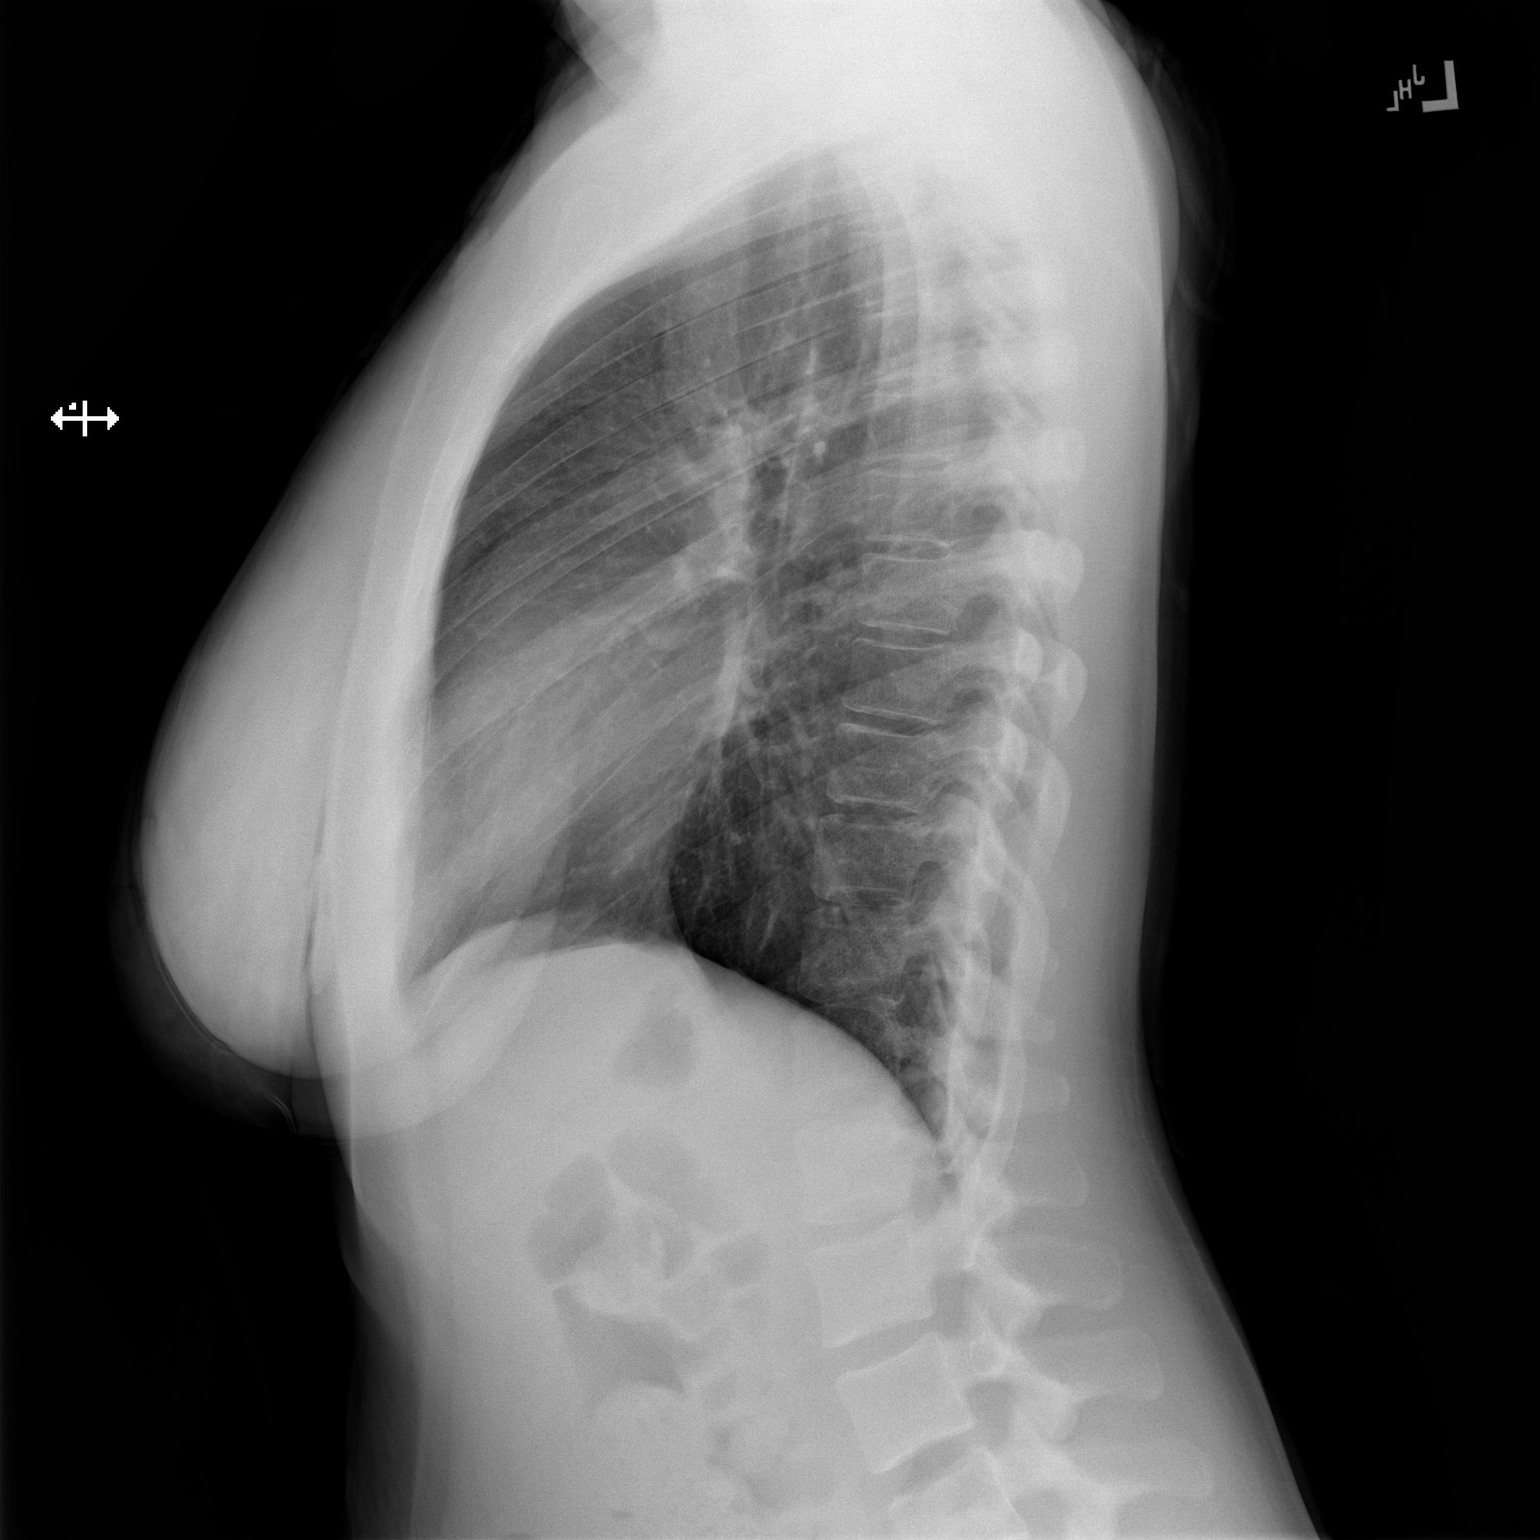

[2 of 2 positions shown; findings below may reference images not displayed]

FINDINGS: The heart size and mediastinal contours are within normal limits.
Both lungs are clear. The visualized skeletal structures are
unremarkable.
IMPRESSION: No acute abnormality of the lungs.

## 2019-04-24 MED ORDER — LORAZEPAM 2 MG/ML IJ SOLN
0.5000 mg | Freq: Once | INTRAMUSCULAR | Status: AC
  Administered 2019-04-24: 0.5 mg via INTRAVENOUS
  Filled 2019-04-24: qty 1

## 2019-04-24 MED ORDER — HYDROXYZINE HCL 25 MG PO TABS: 25.0000 mg | ORAL_TABLET | Freq: Four times a day (QID) | ORAL | 0 refills | Status: DC

## 2019-04-24 NOTE — ED Notes (Addendum)
Pt denies chest pain and shortness of breath at this time.  Pt able to speak in full sentences.

## 2019-04-24 NOTE — ED Notes (Signed)
Pt ambulatory to Xray.

## 2019-04-24 NOTE — ED Notes (Signed)
Pt ambulatory to bathroom, no assistance needed.  

## 2019-04-24 NOTE — Discharge Instructions (Signed)
I provided you with some outpatient counseling resources that you can follow-up with.  Return the emergency department for any chest pain, difficulty breathing, abdominal pain, vomiting or any other worsening concerning symptoms.

## 2019-04-24 NOTE — ED Triage Notes (Signed)
Patient arrived with complaints of anxiousness. Reporting at the time she felt short of breath and had chest wall pain, patient in no distress upon arrival but reporting the chest wall pain has not resolved

## 2019-04-24 NOTE — ED Provider Notes (Signed)
[facility]-EMERGENCY DEPT Provider Note [id number] Arrival date & time: 04/24/19  7829     History Chief Complaint  Patient presents with  . Anxiety    Melody Williams is a 28 y.o. female possible history of anxiety, eczema who presents for evaluation of anxiety attack that began about 4 AM this morning.  She states that she has history of anxiety and panic attacks.  She reports about 4 AM this morning, she woke up and felt "jittery" and was having chest pain.  She states that the pain was a pressure-like sensation that it caused pressure in her neck and in her ears.  She did not have any associated diaphoresis, nausea/vomiting.  She stated she had some mild shortness of breath initially when she woke up but describes it as feeling like she could not catch her breath.  She has since not had any other shortness of breath.  She states that chest pain continued until about 10 minutes prior to ED arrival.  She states that she has had history of anxiety attacks that have presented similarly though she reports that the chest pain had lasted a little bit longer today which concerned her and brought her to the emergency department.  Patient states that over the last several days, she has had more frequent panic attacks.  She does report some stressful situations that of been going on that she feels like is contributing.  She has a history of panic attacks but states that she does not currently follow with any doctor or see any therapist.  She took ibuprofen with minimal improvement in her symptoms.  She denies any SI, HI.  She is not a current smoker.  Denies any cocaine, heroin, marijuana use.  Denies any personal cardiac history or family history of MI before the age of 42. She denies any OCP use, recent immobilization, prior history of DVT/PE, recent surgery, leg swelling, or long travel. She denies any SI/HI.   The history is provided by the patient.       Past Medical  History:  Diagnosis Date  . Acute meniscal tear of knee LEFT  . Anxiety   . Eczema   . Eczema     Patient Active Problem List   Diagnosis Date Noted  . History of prior pregnancy with IUGR newborn 01/17/2018  . Depression 01/01/2014    Past Surgical History:  Procedure Laterality Date  . CESAREAN SECTION N/A 08/05/2012   Procedure: CESAREAN SECTION;  Surgeon: Adam Phenix, MD;  Location: [facility];  Service: Obstetrics;  Laterality: N/A;  Twins  . KNEE ARTHROSCOPY  05/01/2011   Procedure: ARTHROSCOPY KNEE;  Surgeon: Javier Docker;  Location: [facility];  Service: Orthopedics;  Laterality: Left;  left knee arthroscopy with debridement ,removal of loose  bodies     OB History    Gravida  2   Para  2   Term  1   Preterm  1   AB  0   Living  3     SAB  0   TAB  0   Ectopic  0   Multiple  1   Live Births  3           Family History  Problem Relation Age of Onset  . Healthy Mother   . Healthy Father     Social History   Tobacco Use  . Smoking status: Never Smoker  . Smokeless tobacco: Never Used  Substance Use Topics  . Alcohol use: No  . Drug use: No    Home Medications Prior to Admission medications   Medication Sig Start Date End Date Taking? Authorizing Provider  fluticasone (FLONASE) 50 MCG/ACT nasal spray Place 1 spray into both nostrils daily as needed for allergies or rhinitis.  12/26/18  Yes [provider]  ibuprofen (ADVIL) 200 MG tablet Take 200 mg by mouth every 6 (six) hours as needed for headache or mild pain.   Yes [provider]  hydrOXYzine (ATARAX/VISTARIL) 25 MG tablet Take 1 tablet (25 mg total) by mouth every 6 (six) hours. 04/24/19   Volanda Napoleon, PA-C  ibuprofen (ADVIL,MOTRIN) 600 MG tablet Take 1 tablet (600 mg total) by mouth every 6 (six) hours. Patient not taking: Reported on 04/24/2019 05/14/18   Guadalupe Dawn, MD  medroxyPROGESTERone (DEPO-PROVERA) 150 MG/ML injection Inject 1 mL  (150 mg total) into the muscle every 3 (three) months. Patient not taking: Reported on 04/24/2019 09/14/18   Shelly Bombard, MD  metroNIDAZOLE (FLAGYL) 500 MG tablet Take 1 tablet (500 mg total) by mouth 2 (two) times daily. Patient not taking: Reported on 09/14/2018 06/14/18   Woodroe Mode, MD    Allergies    Citrus and Cashew nut oil  Review of Systems   Review of Systems  Constitutional: Negative for diaphoresis and fever.  Respiratory: Negative for cough and shortness of breath.   Cardiovascular: Positive for chest pain.  Gastrointestinal: Negative for abdominal pain, nausea and vomiting.  Genitourinary: Negative for dysuria and hematuria.  Neurological: Negative for headaches.  Psychiatric/Behavioral: Negative for suicidal ideas. The patient is nervous/anxious.   All other systems reviewed and are negative.   Physical Exam Updated Vital Signs BP 126/87   Pulse 84   Temp 98.8 F (37.1 C) (Oral)   Resp 15   Ht 5\' 4"  (1.626 m)   Wt 66.2 kg   SpO2 99%   BMI 25.06 kg/m   Physical Exam Vitals and nursing note reviewed.  Constitutional:      Appearance: Normal appearance. She is well-developed.     Comments: Anxious  HENT:     Head: Normocephalic and atraumatic.  Eyes:     General: Lids are normal.     Conjunctiva/sclera: Conjunctivae normal.     Pupils: Pupils are equal, round, and reactive to light.  Cardiovascular:     Rate and Rhythm: Normal rate and regular rhythm.     Pulses: Normal pulses.     Heart sounds: Normal heart sounds. No murmur. No friction rub. No gallop.   Pulmonary:     Effort: Pulmonary effort is normal.     Breath sounds: Normal breath sounds.     Comments: Lungs clear to auscultation bilaterally.  Symmetric chest rise.  No wheezing, rales, rhonchi. Abdominal:     Palpations: Abdomen is soft. Abdomen is not rigid.     Tenderness: There is no abdominal tenderness. There is no guarding.     Comments: Abdomen is soft, non-distended, non-tender.  No rigidity, No guarding. No peritoneal signs.  Musculoskeletal:        General: Normal range of motion.     Cervical back: Full passive range of motion without pain.  Skin:    General: Skin is warm and dry.     Capillary Refill: Capillary refill takes less than 2 seconds.  Neurological:     Mental Status: She is alert and oriented to person, place, and time.  Psychiatric:  Speech: Speech normal.     ED Results / Procedures / Treatments   Labs (all labs ordered are listed, but only abnormal results are displayed) Labs Reviewed  BASIC METABOLIC PANEL - Abnormal; Notable for the following components:      Result Value   Calcium 8.8 (*)    All other components within normal limits  CBC WITH DIFFERENTIAL/PLATELET  I-STAT BETA HCG BLOOD, ED ([facility], [facility], [facility] ONLY)  TROPONIN I (HIGH SENSITIVITY)    EKG EKG Interpretation  Date/Time:  Monday April 24 2019 07:03:43 EST Ventricular Rate:  66 PR Interval:    QRS Duration: 97 QT Interval:  414 QTC Calculation: 434 R Axis:   40 Text Interpretation: Sinus rhythm Probable left atrial enlargement RSR' in V1 or V2, probably normal variant Baseline wander in lead(s) II III aVF Confirmed by Bethann BerkshireZammit, Joseph [phone]([id number]) on 04/24/2019 10:21:26 AM   Radiology DG Chest 2 View  Result Date: 04/24/2019 CLINICAL DATA:  Shortness of breath, anxiety, chest pain EXAM: CHEST - 2 VIEW COMPARISON:  01/26/2017 FINDINGS: The heart size and mediastinal contours are within normal limits. Both lungs are clear. The visualized skeletal structures are unremarkable. IMPRESSION: No acute abnormality of the lungs. Electronically Signed   By: Lauralyn PrimesAlex  Bibbey M.D.   On: 04/24/2019 10:13    Procedures Procedures (including critical care time)  Medications Ordered in ED Medications  LORazepam (ATIVAN) injection 0.5 mg (0.5 mg Intravenous Given 04/24/19 0844)    ED Course  I have reviewed the triage vital signs and the nursing notes.  Pertinent labs & imaging  results that were available during my care of the patient were reviewed by me and considered in my medical decision making (see chart for details).    MDM Rules/Calculators/A&P        28 y.o. female past medical history of anxiety who presents for evaluation of panic attack.  She reports that this morning about 4 AM, she had an episode of anxiety where she felt jittery and had chest pain that she describes as a pressure.  The pressure went into her neck and into her ears.  She states that the pain resolved by itself about 10 minutes prior to ED arrival.  She has a history of panic attacks but states that the chest pain lasted a bit longer which prompted ED visit.  Currently denies any pain or difficulty breathing.  On initial ED arrival, she is afebrile, nontoxic-appearing.  Vital signs are stable.  Doubt ACS etiology given atypical presentation. Patient is PERC negative and is low risk for PE.  Do not suspect that this is PE.  She does not have any history of risk factors and is not hypoxic or tachycardic.  She has well-documented history of anxiety that has presented similarly.  We will plan to check basic labs, EKG.  I-stat beta is negative.  Trop negative.  BMP is unremarkable.  CBC without any significant leukocytosis or anemia.  Chest x-ray negative for any acute abnormalities.  Patient is asymptomatic at this time.  Her story sounds very atypical and she does not have any cardiac risk factors. At this time, I feel that this is more related to anxiety and she does report that she has had similar episodes with anxiety in the past.  Sitting comfortably in bed with no signs of distress.  Patient states that she feels better.  She is hemodynamically stable.  I discussed with patient that I think this is most likely representative of her panic attacks.  At this time, do not suspect ACS etiology as a source of her symptoms.  Patient does not currently have an outpatient therapist.  We will give her  outpatient resources.  She denies any active HI/SI. At this time, patient exhibits no emergent life-threatening condition that require further evaluation in ED or admission. Patient had ample opportunity for questions and discussion. All patient's questions were answered with full understanding. Strict return precautions discussed. Patient expresses understanding and agreement to plan.   Portions of this note were generated with Scientist, clinical (histocompatibility and immunogenetics). Dictation errors may occur despite best attempts at proofreading.   Final Clinical Impression(s) / ED Diagnoses Final diagnoses:  Anxiety  Atypical chest pain    Rx / DC Orders ED Discharge Orders         Ordered    hydrOXYzine (ATARAX/VISTARIL) 25 MG tablet  Every 6 hours     04/24/19 1044           Rosana Hoes 04/24/19 1247    Bethann Berkshire, MD 04/25/19 1032

## 2019-10-23 ENCOUNTER — Encounter: Payer: Self-pay | Admitting: Podiatry

## 2019-10-23 ENCOUNTER — Ambulatory Visit (INDEPENDENT_AMBULATORY_CARE_PROVIDER_SITE_OTHER): Payer: Medicaid Other

## 2019-10-23 ENCOUNTER — Ambulatory Visit: Payer: Medicaid Other | Admitting: Podiatry

## 2019-10-23 ENCOUNTER — Other Ambulatory Visit: Payer: Self-pay

## 2019-10-23 VITALS — HR 98

## 2019-10-23 DIAGNOSIS — M2011 Hallux valgus (acquired), right foot: Secondary | ICD-10-CM | POA: Diagnosis not present

## 2019-10-23 DIAGNOSIS — M2012 Hallux valgus (acquired), left foot: Secondary | ICD-10-CM

## 2019-10-23 NOTE — Patient Instructions (Signed)
Bunion  A bunion is a bump on the base of the big toe that forms when the bones of the big toe joint move out of position. Bunions may be small at first, but they often get larger over time. They can make walking painful. What are the causes? A bunion may be caused by:  Wearing narrow or pointed shoes that force the big toe to press against the other toes.  Abnormal foot development that causes the foot to roll inward (pronate).  Changes in the foot that are caused by certain diseases, such as rheumatoid arthritis or polio.  A foot injury. What increases the risk? The following factors may make you more likely to develop this condition:  Wearing shoes that squeeze the toes together.  Having certain diseases, such as: ? Rheumatoid arthritis. ? Polio. ? Cerebral palsy.  Having family members who have bunions.  Being born with a foot deformity, such as flat feet or low arches.  Doing activities that put a lot of pressure on the feet, such as ballet dancing. What are the signs or symptoms? The main symptom of a bunion is a noticeable bump on the big toe. Other symptoms may include:  Pain.  Swelling around the big toe.  Redness and inflammation.  Thick or hardened skin on the big toe or between the toes.  Stiffness or loss of motion in the big toe.  Trouble with walking. How is this diagnosed? A bunion may be diagnosed based on your symptoms, medical history, and activities. You may have tests, such as:  X-rays. These allow your health care provider to check the position of the bones in your foot and look for damage to your joint. They also help your health care provider determine the severity of your bunion and the best way to treat it.  Joint aspiration. In this test, a sample of fluid is removed from the toe joint. This test may be done if you are in a lot of pain. It helps rule out diseases that cause painful swelling of the joints, such as arthritis. How is this  treated? Treatment depends on the severity of your symptoms. The goal of treatment is to relieve symptoms and prevent the bunion from getting worse. Your health care provider may recommend:  Wearing shoes that have a wide toe box.  Using bunion pads to cushion the affected area.  Taping your toes together to keep them in a normal position.  Placing a device inside your shoe (orthotics) to help reduce pressure on your toe joint.  Taking medicine to ease pain, inflammation, and swelling.  Applying heat or ice to the affected area.  Doing stretching exercises.  Surgery to remove scar tissue and move the toes back into their normal position. This treatment is rare. Follow these instructions at home: Managing pain, stiffness, and swelling   If directed, put ice on the painful area: ? Put ice in a plastic bag. ? Place a towel between your skin and the bag. ? Leave the ice on for 20 minutes, 2-3 times a day. Activity   If directed, apply heat to the affected area before you exercise. Use the heat source that your health care provider recommends, such as a moist heat pack or a heating pad. ? Place a towel between your skin and the heat source. ? Leave the heat on for 20-30 minutes. ? Remove the heat if your skin turns bright red. This is especially important if you are unable to feel pain,   heat, or cold. You may have a greater risk of getting burned.  Do exercises as told by your health care provider. General instructions  Support your toe joint with proper footwear, shoe padding, or taping as told by your health care provider.  Take over-the-counter and prescription medicines only as told by your health care provider.  Keep all follow-up visits as told by your health care provider. This is important. Contact a health care provider if your symptoms:  Get worse.  Do not improve in 2 weeks. Get help right away if you have:  Severe pain and trouble with walking. Summary  A  bunion is a bump on the base of the big toe that forms when the bones of the big toe joint move out of position.  Bunions can make walking painful.  Treatment depends on the severity of your symptoms.  Support your toe joint with proper footwear, shoe padding, or taping as told by your health care provider. This information is not intended to replace advice given to you by your health care provider. Make sure you discuss any questions you have with your health care provider. Document Revised: 11/01/2017 Document Reviewed: 09/07/2017 Elsevier Patient Education  2020 [employer]  

## 2019-10-23 NOTE — Progress Notes (Signed)
Subjective:   Patient ID: Melody Williams, female   DOB: 29 y.o.   MRN: [id number]   HPI Patient states that she has had problems with bunions for years and is difficult for her to wear shoe gear comfortably and she is try wider shoes and different modalities without treatment.  States at 1 point she like to get them corrected but she possibly could be pregnant currently.  Patient does not smoke   Review of Systems  All other systems reviewed and are negative.       Objective:  Physical Exam Vitals and nursing note reviewed.  Constitutional:      Appearance: She is well-developed.  Pulmonary:     Effort: Pulmonary effort is normal.  Musculoskeletal:        General: Normal range of motion.  Skin:    General: Skin is warm.  Neurological:     Mental Status: She is alert.     Neurovascular status intact muscle strength adequate range of motion within normal limits.  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  If it turns out she is not pregnant she come and get x-rays and then at that point we can decide on surgical intervention

## 2019-11-17 ENCOUNTER — Ambulatory Visit: Payer: Medicaid Other | Admitting: Podiatry

## 2020-01-18 ENCOUNTER — Ambulatory Visit: Payer: Medicaid Other | Admitting: Podiatry

## 2020-01-25 ENCOUNTER — Other Ambulatory Visit: Payer: Self-pay | Admitting: Podiatry

## 2020-01-25 ENCOUNTER — Ambulatory Visit: Payer: Medicaid Other | Admitting: Podiatry

## 2020-01-25 DIAGNOSIS — Q666 Other congenital valgus deformities of feet: Secondary | ICD-10-CM

## 2020-02-14 ENCOUNTER — Ambulatory Visit: Payer: Medicaid Other | Admitting: Podiatry

## 2020-02-16 ENCOUNTER — Ambulatory Visit: Payer: Medicaid Other | Admitting: Podiatry

## 2020-02-18 ENCOUNTER — Encounter (HOSPITAL_COMMUNITY): Payer: Self-pay

## 2020-02-18 ENCOUNTER — Emergency Department (HOSPITAL_COMMUNITY)
Admission: EM | Admit: 2020-02-18 | Discharge: 2020-02-18 | Disposition: A | Discharge status: Home / Self Care | Payer: Medicaid Other | Attending: Emergency Medicine | Admitting: Emergency Medicine

## 2020-02-18 DIAGNOSIS — U071 COVID-19: Secondary | ICD-10-CM | POA: Insufficient documentation

## 2020-02-18 DIAGNOSIS — R519 Headache, unspecified: Secondary | ICD-10-CM

## 2020-02-18 LAB — I-STAT CHEM 8, ED
BUN: 6 mg/dL (ref 6–20)
Calcium, Ion: 1.22 mmol/L (ref 1.15–1.40)
Chloride: 102 mmol/L (ref 98–111)
Creatinine, Ser: 0.7 mg/dL (ref 0.44–1.00)
Glucose, Bld: 76 mg/dL (ref 70–99)
HCT: 41 % (ref 36.0–46.0)
Hemoglobin: 13.9 g/dL (ref 12.0–15.0)
Potassium: 3.7 mmol/L (ref 3.5–5.1)
Sodium: 139 mmol/L (ref 135–145)
TCO2: 27 mmol/L (ref 22–32)

## 2020-02-18 LAB — RESPIRATORY PANEL BY RT PCR (FLU A&B, COVID)
Influenza A by PCR: NEGATIVE
Influenza B by PCR: NEGATIVE
SARS Coronavirus 2 by RT PCR: POSITIVE — AB

## 2020-02-18 LAB — I-STAT BETA HCG BLOOD, ED (MC, WL, AP ONLY): I-stat hCG, quantitative: <5 m[IU]/mL (ref <5)

## 2020-02-18 MED ORDER — ONDANSETRON 8 MG PO TBDP: 8.0000 mg | ORAL_TABLET | Freq: Three times a day (TID) | ORAL | 0 refills | Status: DC | PRN

## 2020-02-18 MED ORDER — ONDANSETRON 4 MG PO TBDP
4.0000 mg | ORAL_TABLET | Freq: Once | ORAL | Status: AC
  Administered 2020-02-18: 4 mg via ORAL
  Filled 2020-02-18: qty 1

## 2020-02-18 MED ORDER — NAPROXEN 375 MG PO TABS: 375.0000 mg | ORAL_TABLET | Freq: Two times a day (BID) | ORAL | 0 refills | Status: DC

## 2020-02-18 MED ORDER — IBUPROFEN 200 MG PO TABS
600.0000 mg | ORAL_TABLET | Freq: Once | ORAL | Status: AC
  Administered 2020-02-18: 600 mg via ORAL
  Filled 2020-02-18: qty 3

## 2020-02-18 NOTE — ED Provider Notes (Signed)
[facility]-EMERGENCY DEPT Provider Note   CSN: [id number] Arrival date & time: 02/18/20  1347     History Chief Complaint  Patient presents with  . Back Pain    Melody Williams is a 29 y.o. female.  HPI   Patient states on Friday she started having difficulty with a headache.  Patient was having pain in the back of her head she was also having pain in her neck.  She has discomfort when she moves her eyes.  She also noticed that her skin feels hypersensitive to the touch.  Patient also is experiencing abdominal cramping.  Patient states she is feeling nauseated.  She denies any fevers or chills.  No vomiting or diarrhea.  No dysuria.  No known ill contacts.  Patient has not been vaccinated for Covid  Past Medical History:  Diagnosis Date  . Acute meniscal tear of knee LEFT  . Anxiety   . Eczema   . Eczema     Patient Active Problem List   Diagnosis Date Noted  . History of prior pregnancy with IUGR newborn 01/17/2018  . Encounter for surveillance of contraceptives 04/11/2014  . Depression 01/01/2014    Past Surgical History:  Procedure Laterality Date  . CESAREAN SECTION N/A 08/05/2012   Procedure: CESAREAN SECTION;  Surgeon: Adam Phenix, MD;  Location: [facility];  Service: Obstetrics;  Laterality: N/A;  Twins  . KNEE ARTHROSCOPY  05/01/2011   Procedure: ARTHROSCOPY KNEE;  Surgeon: Javier Docker;  Location:  [facility];  Service: Orthopedics;  Laterality: Left;  left knee arthroscopy with debridement ,removal of loose  bodies     OB History    Gravida  2   Para  2   Term  1   Preterm  1   AB  0   Living  3     SAB  0   TAB  0   Ectopic  0   Multiple  1   Live Births  3           Family History  Problem Relation Age of Onset  . Healthy Mother   . Healthy Father     Social History   Tobacco Use  . Smoking status: Never Smoker  . Smokeless tobacco: Never Used  Vaping Use  . Vaping Use: Never used    Substance Use Topics  . Alcohol use: No  . Drug use: No    Home Medications Prior to Admission medications   Medication Sig Start Date End Date Taking? Authorizing Provider  fluticasone (FLONASE) 50 MCG/ACT nasal spray Place 1 spray into both nostrils daily as needed for allergies or rhinitis.  12/26/18   [provider]  hydrOXYzine (ATARAX/VISTARIL) 25 MG tablet Take 1 tablet (25 mg total) by mouth every 6 (six) hours. 04/24/19   Maxwell Caul, PA-C  ibuprofen (ADVIL) 200 MG tablet Take 200 mg by mouth every 6 (six) hours as needed for headache or mild pain.    [provider]  ibuprofen (ADVIL,MOTRIN) 600 MG tablet Take 1 tablet (600 mg total) by mouth every 6 (six) hours. Patient not taking: Reported on 04/24/2019 05/14/18   Myrene Buddy, MD  medroxyPROGESTERone (DEPO-PROVERA) 150 MG/ML injection Inject 1 mL (150 mg total) into the muscle every 3 (three) months. Patient not taking: Reported on 04/24/2019 09/14/18   Brock Bad, MD  metroNIDAZOLE (FLAGYL) 500 MG tablet Take 1 tablet (500 mg total) by mouth 2 (two) times daily. Patient not  taking: Reported on 09/14/2018 06/14/18   Adam Phenix, MD  naproxen (NAPROSYN) 375 MG tablet Take 1 tablet (375 mg total) by mouth 2 (two) times daily. 02/18/20   Linwood Dibbles, MD  ondansetron (ZOFRAN ODT) 8 MG disintegrating tablet Take 1 tablet (8 mg total) by mouth every 8 (eight) hours as needed for nausea or vomiting. 02/18/20   Linwood Dibbles, MD    Allergies    Citrus and Cashew nut oil  Review of Systems   Review of Systems  All other systems reviewed and are negative.   Physical Exam Updated Vital Signs BP 109/84 (BP Location: Right Arm)   Pulse 82   Temp 98.6 F (37 C) (Oral)   Resp 20   Ht 1.626 m (5\' 4" )   Wt 66.2 kg   LMP 02/02/2020   SpO2 100%   BMI 25.05 kg/m   Physical Exam Vitals and nursing note reviewed.  Constitutional:      General: She is not in acute distress.    Appearance: She is  well-developed.  HENT:     Head: Normocephalic and atraumatic.     Right Ear: Tympanic membrane and external ear normal.     Left Ear: Tympanic membrane and external ear normal.     Nose: Nose normal. No rhinorrhea.     Mouth/Throat:     Mouth: Mucous membranes are moist.     Pharynx: Oropharynx is clear. No oropharyngeal exudate or posterior oropharyngeal erythema.  Eyes:     General: No scleral icterus.       Right eye: No discharge.        Left eye: No discharge.     Conjunctiva/sclera: Conjunctivae normal.  Neck:     Trachea: No tracheal deviation.  Cardiovascular:     Rate and Rhythm: Normal rate and regular rhythm.  Pulmonary:     Effort: Pulmonary effort is normal. No respiratory distress.     Breath sounds: Normal breath sounds. No stridor. No wheezing or rales.  Abdominal:     General: Bowel sounds are normal. There is no distension.     Palpations: Abdomen is soft.     Tenderness: There is no abdominal tenderness. There is no guarding or rebound.  Musculoskeletal:        General: No tenderness.     Cervical back: Normal range of motion and neck supple. No rigidity or tenderness.  Lymphadenopathy:     Cervical: No cervical adenopathy.  Skin:    General: Skin is warm and dry.     Findings: No rash.  Neurological:     Mental Status: She is alert.     Cranial Nerves: No cranial nerve deficit (no facial droop, extraocular movements intact, no slurred speech).     Sensory: No sensory deficit.     Motor: No abnormal muscle tone or seizure activity.     Coordination: Coordination normal.     ED Results / Procedures / Treatments   Labs (all labs ordered are listed, but only abnormal results are displayed) Labs Reviewed  RESPIRATORY PANEL BY RT PCR (FLU A&B, COVID)  I-STAT CHEM 8, ED  I-STAT BETA HCG BLOOD, ED ([facility], [facility], [facility] ONLY)    EKG None  Radiology No results found.  Procedures Procedures (including critical care time)  Medications Ordered in  ED Medications  ondansetron (ZOFRAN-ODT) disintegrating tablet 4 mg (4 mg Oral Given 02/18/20 1430)  ibuprofen (ADVIL) tablet 600 mg (600 mg Oral Given 02/18/20 1430)    ED  Course  I have reviewed the triage vital signs and the nursing notes.  Pertinent labs & imaging results that were available during my care of the patient were reviewed by me and considered in my medical decision making (see chart for details).    MDM Rules/Calculators/A&P                          Patient presented to ED for evaluation of malaise, nausea back and headache.  Patient's denies any fevers.  No abdominal pain.  No coughing.  No vomiting or diarrhea.  Physical exam is reassuring.  She has no focal tenderness.  No meningismus.  Lungs are clear.  No abdominal tenderness.  Doubt pharyngitis, meningitis, urinary tract infection, pneumonia or other emergency issue.  Symptoms are not suggestive of Covid however will test her as it is possible that she could have an early infection.  Patient started to check her results on MyChart. Final Clinical Impression(s) / ED Diagnoses Final diagnoses:  Nonintractable headache, unspecified chronicity pattern, unspecified headache type    Rx / DC Orders ED Discharge Orders         Ordered    ondansetron (ZOFRAN ODT) 8 MG disintegrating tablet  Every 8 hours PRN        02/18/20 1607    naproxen (NAPROSYN) 375 MG tablet  2 times daily        02/18/20 1607           Linwood Dibbles, MD 02/18/20 1609

## 2020-02-18 NOTE — ED Triage Notes (Signed)
Pt arrived via EMS, from home, c/o back/neck/head and eye pain since Friday. Denies any sick contacts.  124/66  85 HR 100% RA 19 RR CBG 93

## 2020-02-18 NOTE — Discharge Instructions (Addendum)
Monitor for fevers, vomiting, diarrhea, urinary symptoms or other worsening symptoms.  Follow-up with your doctor next week if symptoms have not resolved.  Your Covid/flu test should be available later this evening.  Please check your results on MyChart

## 2020-02-19 ENCOUNTER — Telehealth: Payer: Self-pay | Admitting: Unknown Physician Specialty

## 2020-02-19 NOTE — Telephone Encounter (Signed)
Called to Discuss with patient about Covid symptoms and the use of the monoclonal antibody infusion for those with mild to moderate Covid symptoms and at a high risk of hospitalization.     Pt appears to qualify for this infusion due to co-morbid conditions and/or a member of an at-risk group in accordance with the FDA Emergency Use Authorization.    Unable to reach pt    

## 2020-02-19 NOTE — Telephone Encounter (Signed)
I connected by phone with Diamantina Providence on 02/19/2020 at 9:54 AM to discuss the potential use of a new treatment for mild to moderate COVID-19 viral infection in non-hospitalized patients.  This patient is a 29 y.o. female that meets the FDA criteria for Emergency Use Authorization of COVID monoclonal antibody casirivimab/imdevimab or bamlanivimab/eteseviamb.  Has a (+) direct SARS-CoV-2 viral test result  Has mild or moderate COVID-19   Is NOT hospitalized due to COVID-19  Is within 10 days of symptom onset  Has at least one of the high risk factor(s) for progression to severe COVID-19 and/or hospitalization as defined in EUA.  Specific high risk criteria : BMI > 25   I have spoken and communicated the following to the patient or parent/caregiver regarding COVID monoclonal antibody treatment:  1. FDA has authorized the emergency use for the treatment of mild to moderate COVID-19 in adults and pediatric patients with positive results of direct SARS-CoV-2 viral testing who are 28 years of age and older weighing at least 40 kg, and who are at high risk for progressing to severe COVID-19 and/or hospitalization.  2. The significant known and potential risks and benefits of COVID monoclonal antibody, and the extent to which such potential risks and benefits are unknown.  3. Information on available alternative treatments and the risks and benefits of those alternatives, including clinical trials.  4. Patients treated with COVID monoclonal antibody should continue to self-isolate and use infection control measures (e.g., wear mask, isolate, social distance, avoid sharing personal items, clean and disinfect "high touch" surfaces, and frequent handwashing) according to CDC guidelines.   5. The patient or parent/caregiver has the option to accept or refuse COVID monoclonal antibody treatment.  After reviewing this information with the patient, the patient has DECLINED offer to receive the  infusion.as she is feeling better with mild symptoms.  Will call back if continued problems.    Gabriel Cirri, NP 02/19/2020 9:54 AM  Sx onset 10/8

## 2020-02-20 ENCOUNTER — Telehealth: Payer: Self-pay | Admitting: Physician Assistant

## 2020-02-20 NOTE — Telephone Encounter (Signed)
Called to Discuss with patient about Covid symptoms and the use of the monoclonal antibody infusion for those with mild to moderate Covid symptoms and at a high risk of hospitalization.     Pt appears to qualify for this infusion due to co-morbid conditions and/or a member of an at-risk group in accordance with the FDA Emergency Use Authorization.    Unable to reach pt - unable to leave a VM. I was able to send a MyChart message.   Pt qualifies with [facility]VI.

## 2020-02-21 ENCOUNTER — Ambulatory Visit: Payer: Medicaid Other | Admitting: Podiatry

## 2020-08-07 ENCOUNTER — Ambulatory Visit (INDEPENDENT_AMBULATORY_CARE_PROVIDER_SITE_OTHER): Payer: Medicaid Other

## 2020-08-07 ENCOUNTER — Encounter: Payer: Self-pay | Admitting: Podiatry

## 2020-08-07 ENCOUNTER — Ambulatory Visit (INDEPENDENT_AMBULATORY_CARE_PROVIDER_SITE_OTHER): Payer: Medicaid Other | Admitting: Podiatry

## 2020-08-07 ENCOUNTER — Other Ambulatory Visit: Payer: Self-pay

## 2020-08-07 DIAGNOSIS — M2011 Hallux valgus (acquired), right foot: Secondary | ICD-10-CM

## 2020-08-07 DIAGNOSIS — M2012 Hallux valgus (acquired), left foot: Secondary | ICD-10-CM

## 2020-08-07 NOTE — Progress Notes (Signed)
Subjective:   Patient ID: Melody Williams, female   DOB: 30 y.o.   MRN: [id number]   HPI Patient presents stating that her bunions are really bothering her she is not pregnant and she wants to get these fixed.  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  Patient is willing to get this done understands there is no guarantee as far as what we can or cannot accomplish and patient is willing to accept this.  Patient after extensive review signed consent form understands risk and is scheduled for outpatient surgery [facility] encouraged to call with questions understanding total recovery can take 6 months to 1 year  X-rays indicate structural bunion deformity right over left with elevation of the intermetatarsal angle right over left

## 2020-08-26 MED ORDER — OXYCODONE-ACETAMINOPHEN 10-325 MG PO TABS: 1.0000 | ORAL_TABLET | ORAL | 0 refills | Status: DC | PRN

## 2020-08-26 MED ORDER — ONDANSETRON HCL 4 MG PO TABS: 4.0000 mg | ORAL_TABLET | Freq: Three times a day (TID) | ORAL | 0 refills | Status: DC | PRN

## 2020-08-26 NOTE — Addendum Note (Signed)
Addended by: Lenn Sink on: 08/26/2020 01:29 PM   Modules accepted: Orders

## 2020-08-27 ENCOUNTER — Encounter: Payer: Self-pay | Admitting: Podiatry

## 2020-08-27 DIAGNOSIS — M2011 Hallux valgus (acquired), right foot: Secondary | ICD-10-CM | POA: Diagnosis not present

## 2020-09-02 ENCOUNTER — Encounter: Payer: Medicaid Other | Admitting: Podiatry

## 2020-09-04 ENCOUNTER — Encounter: Payer: Self-pay | Admitting: Podiatry

## 2020-09-04 ENCOUNTER — Ambulatory Visit (INDEPENDENT_AMBULATORY_CARE_PROVIDER_SITE_OTHER): Payer: Medicaid Other

## 2020-09-04 ENCOUNTER — Ambulatory Visit (INDEPENDENT_AMBULATORY_CARE_PROVIDER_SITE_OTHER): Payer: Medicaid Other | Admitting: Podiatry

## 2020-09-04 ENCOUNTER — Other Ambulatory Visit: Payer: Self-pay

## 2020-09-04 DIAGNOSIS — M2012 Hallux valgus (acquired), left foot: Secondary | ICD-10-CM

## 2020-09-04 DIAGNOSIS — M2011 Hallux valgus (acquired), right foot: Secondary | ICD-10-CM

## 2020-09-06 NOTE — Progress Notes (Signed)
Subjective:   Patient ID: Melody Williams, female   DOB: 30 y.o.   MRN: [id number]   HPI Patient states she is doing very well with surgery and pleased so far with only minimal discomfort   ROS      Objective:  Physical Exam  Neurovascular status intact negative Denna Haggard' sign noted wound edges well coapted alignment good     Assessment:  Doing well post osteotomy first metatarsal right     Plan:  X-rays reviewed continue with compression elevation immobilization and reappoint to recheck again in approximate 4 weeks and continue taking it easy on this foot for that time  X-rays indicate osteotomies healing well fixation in place

## 2020-10-10 ENCOUNTER — Encounter: Payer: Self-pay | Admitting: Podiatry

## 2020-10-10 ENCOUNTER — Ambulatory Visit (INDEPENDENT_AMBULATORY_CARE_PROVIDER_SITE_OTHER): Payer: Medicaid Other

## 2020-10-10 ENCOUNTER — Other Ambulatory Visit: Payer: Self-pay

## 2020-10-10 ENCOUNTER — Ambulatory Visit (INDEPENDENT_AMBULATORY_CARE_PROVIDER_SITE_OTHER): Payer: Medicaid Other | Admitting: Podiatry

## 2020-10-10 DIAGNOSIS — M216X1 Other acquired deformities of right foot: Secondary | ICD-10-CM | POA: Diagnosis not present

## 2020-10-10 DIAGNOSIS — M21052 Valgus deformity, not elsewhere classified, left hip: Secondary | ICD-10-CM

## 2020-10-10 DIAGNOSIS — M21619 Bunion of unspecified foot: Secondary | ICD-10-CM

## 2020-10-10 NOTE — Progress Notes (Signed)
Subjective:   Patient ID: Melody Williams, female   DOB: 30 y.o.   MRN: [id number]   HPI Patient states doing great with the right foot would like to get the left foot fixed which she is on break in July   ROS      Objective:  Physical Exam  Neurovascular status intact negative Denna Haggard' sign noted with patient found to have well-healing surgical site right mild swelling she is very active with significant bunion deformity that is painful left and is failed to respond to conservative     Assessment:  Doing well postop right foot with chronic pain left     Plan:  H&P reviewed condition and for the right foot gradual increase and return to normal activity for the left allowed her to read consent form going over alternative treatments complications patient wants surgery signed consent form scheduled for outpatient surgery left  X-rays right indicate osteotomy is healing well fixation in place joint congruence

## 2020-10-15 ENCOUNTER — Other Ambulatory Visit: Payer: Self-pay | Admitting: Physician Assistant

## 2020-10-15 DIAGNOSIS — R52 Pain, unspecified: Secondary | ICD-10-CM

## 2020-11-07 ENCOUNTER — Telehealth: Payer: Self-pay

## 2020-11-07 NOTE — Telephone Encounter (Signed)
Melody Williams called wanting to reschedule her surgery with Dr. Charlsie Merles on 11/12/2020. She stated she is graduating in a few weeks and wants to wait till after that. I have her rescheduled to 12/17/2020. Notified Dr. Charlsie Merles and Aram Beecham with [facility]

## 2020-11-18 ENCOUNTER — Encounter: Payer: Medicaid Other | Admitting: Podiatry

## 2020-11-28 ENCOUNTER — Inpatient Hospital Stay: Admission: RE | Admit: 2020-11-28 | Payer: Medicaid Other | Source: Ambulatory Visit

## 2020-12-23 ENCOUNTER — Encounter: Payer: Medicaid Other | Admitting: Podiatry

## 2020-12-23 MED ORDER — OXYCODONE-ACETAMINOPHEN 10-325 MG PO TABS: 1.0000 | ORAL_TABLET | ORAL | 0 refills | Status: DC | PRN

## 2020-12-23 MED ORDER — ONDANSETRON HCL 4 MG PO TABS: 4.0000 mg | ORAL_TABLET | Freq: Three times a day (TID) | ORAL | 0 refills | Status: DC | PRN

## 2020-12-23 NOTE — Addendum Note (Signed)
Addended by: Lenn Sink on: 12/23/2020 01:28 PM   Modules accepted: Orders

## 2020-12-24 ENCOUNTER — Encounter: Payer: Self-pay | Admitting: Podiatry

## 2020-12-30 ENCOUNTER — Ambulatory Visit (INDEPENDENT_AMBULATORY_CARE_PROVIDER_SITE_OTHER): Payer: Medicaid Other | Admitting: Podiatry

## 2020-12-30 DIAGNOSIS — M21052 Valgus deformity, not elsewhere classified, left hip: Secondary | ICD-10-CM

## 2021-04-12 ENCOUNTER — Emergency Department (HOSPITAL_COMMUNITY): Payer: Medicaid Other

## 2021-04-12 ENCOUNTER — Emergency Department (HOSPITAL_COMMUNITY)
Admission: EM | Admit: 2021-04-12 | Discharge: 2021-04-12 | Disposition: A | Discharge status: Home / Self Care | Payer: Medicaid Other | Attending: Emergency Medicine | Admitting: Emergency Medicine

## 2021-04-12 ENCOUNTER — Other Ambulatory Visit: Payer: Self-pay

## 2021-04-12 ENCOUNTER — Encounter (HOSPITAL_COMMUNITY): Payer: Self-pay | Admitting: Emergency Medicine

## 2021-04-12 DIAGNOSIS — R0789 Other chest pain: Secondary | ICD-10-CM | POA: Diagnosis not present

## 2021-04-12 DIAGNOSIS — R059 Cough, unspecified: Secondary | ICD-10-CM | POA: Diagnosis not present

## 2021-04-12 DIAGNOSIS — Z20822 Contact with and (suspected) exposure to covid-19: Secondary | ICD-10-CM | POA: Diagnosis not present

## 2021-04-12 DIAGNOSIS — N9489 Other specified conditions associated with female genital organs and menstrual cycle: Secondary | ICD-10-CM | POA: Diagnosis not present

## 2021-04-12 DIAGNOSIS — M62838 Other muscle spasm: Secondary | ICD-10-CM

## 2021-04-12 LAB — CBC
HCT: 39 % (ref 36.0–46.0)
Hemoglobin: 12.9 g/dL (ref 12.0–15.0)
MCH: 31.2 pg (ref 26.0–34.0)
MCHC: 33.1 g/dL (ref 30.0–36.0)
MCV: 94.4 fL (ref 80.0–100.0)
Platelets: 298 10*3/uL (ref 150–400)
RBC: 4.13 MIL/uL (ref 3.87–5.11)
RDW: 11.8 % (ref 11.5–15.5)
WBC: 8 10*3/uL (ref 4.0–10.5)
nRBC: 0 % (ref 0.0–0.2)

## 2021-04-12 LAB — BASIC METABOLIC PANEL
Anion gap: 5 (ref 5–15)
BUN: 12 mg/dL (ref 6–20)
CO2: 25 mmol/L (ref 22–32)
Calcium: 9.3 mg/dL (ref 8.9–10.3)
Chloride: 109 mmol/L (ref 98–111)
Creatinine, Ser: 0.77 mg/dL (ref 0.44–1.00)
GFR, Estimated: >60 mL/min (ref >60)
Glucose, Bld: 85 mg/dL (ref 70–99)
Potassium: 4.7 mmol/L (ref 3.5–5.1)
Sodium: 139 mmol/L (ref 135–145)

## 2021-04-12 LAB — I-STAT BETA HCG BLOOD, ED (MC, WL, AP ONLY): I-stat hCG, quantitative: <5 m[IU]/mL (ref <5)

## 2021-04-12 LAB — RESP PANEL BY RT-PCR (FLU A&B, COVID) ARPGX2
Influenza A by PCR: NEGATIVE
Influenza B by PCR: NEGATIVE
SARS Coronavirus 2 by RT PCR: NEGATIVE

## 2021-04-12 LAB — TROPONIN I (HIGH SENSITIVITY)
Troponin I (High Sensitivity): <2 ng/L (ref <18)
Troponin I (High Sensitivity): <2 ng/L (ref <18)

## 2021-04-12 LAB — D-DIMER, QUANTITATIVE: D-Dimer, Quant: 0.3 ug/mL-FEU (ref 0.00–0.50)

## 2021-04-12 IMAGING — DX DG CHEST 2V
2 series · 2 of 2 positions shown · non-contrast
Comparison: April 24, 2019.

CLINICAL DATA: Chest pain to the center of chest and bilateral
shoulder pain for for 3-4 days.

EXAM:
CHEST - 2 VIEW

[chest pa]
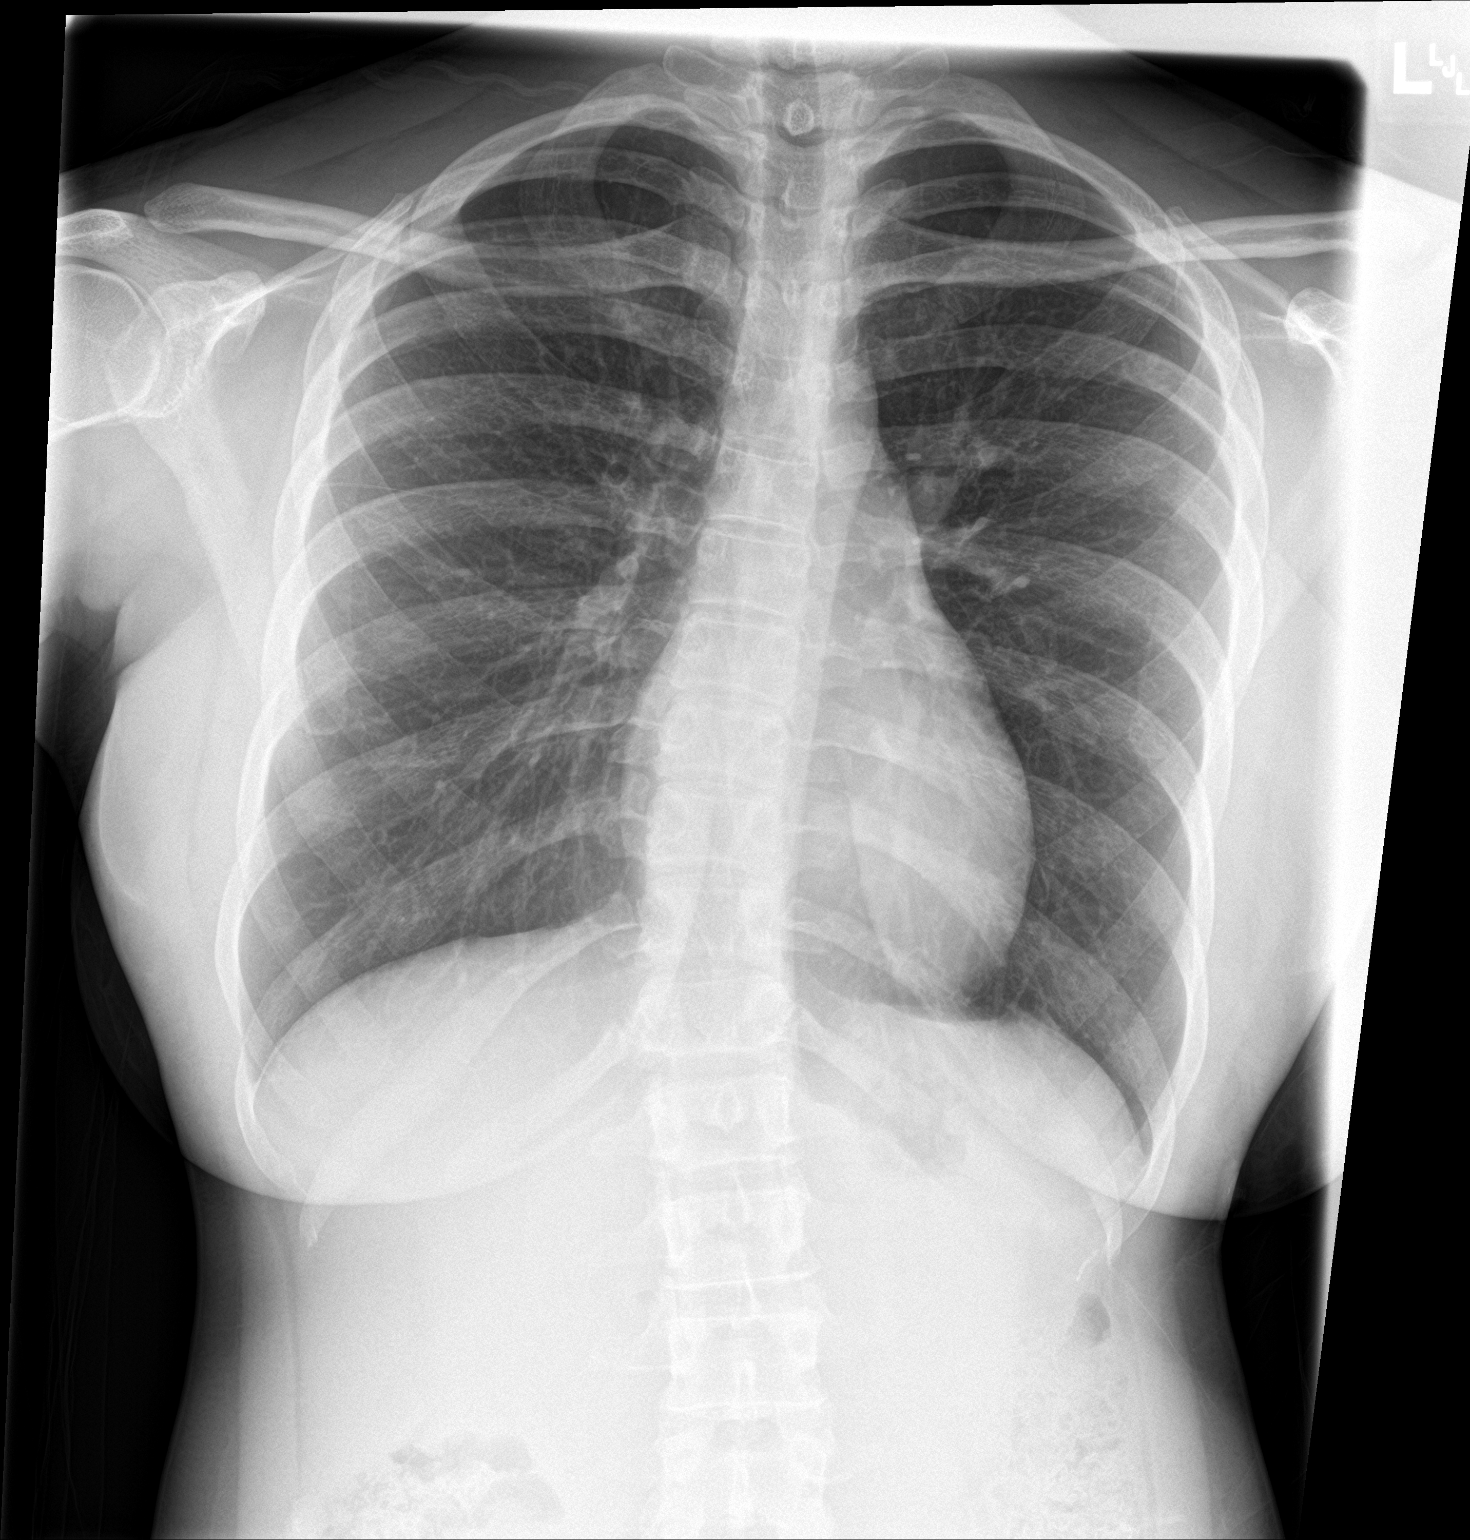

[chest lat]
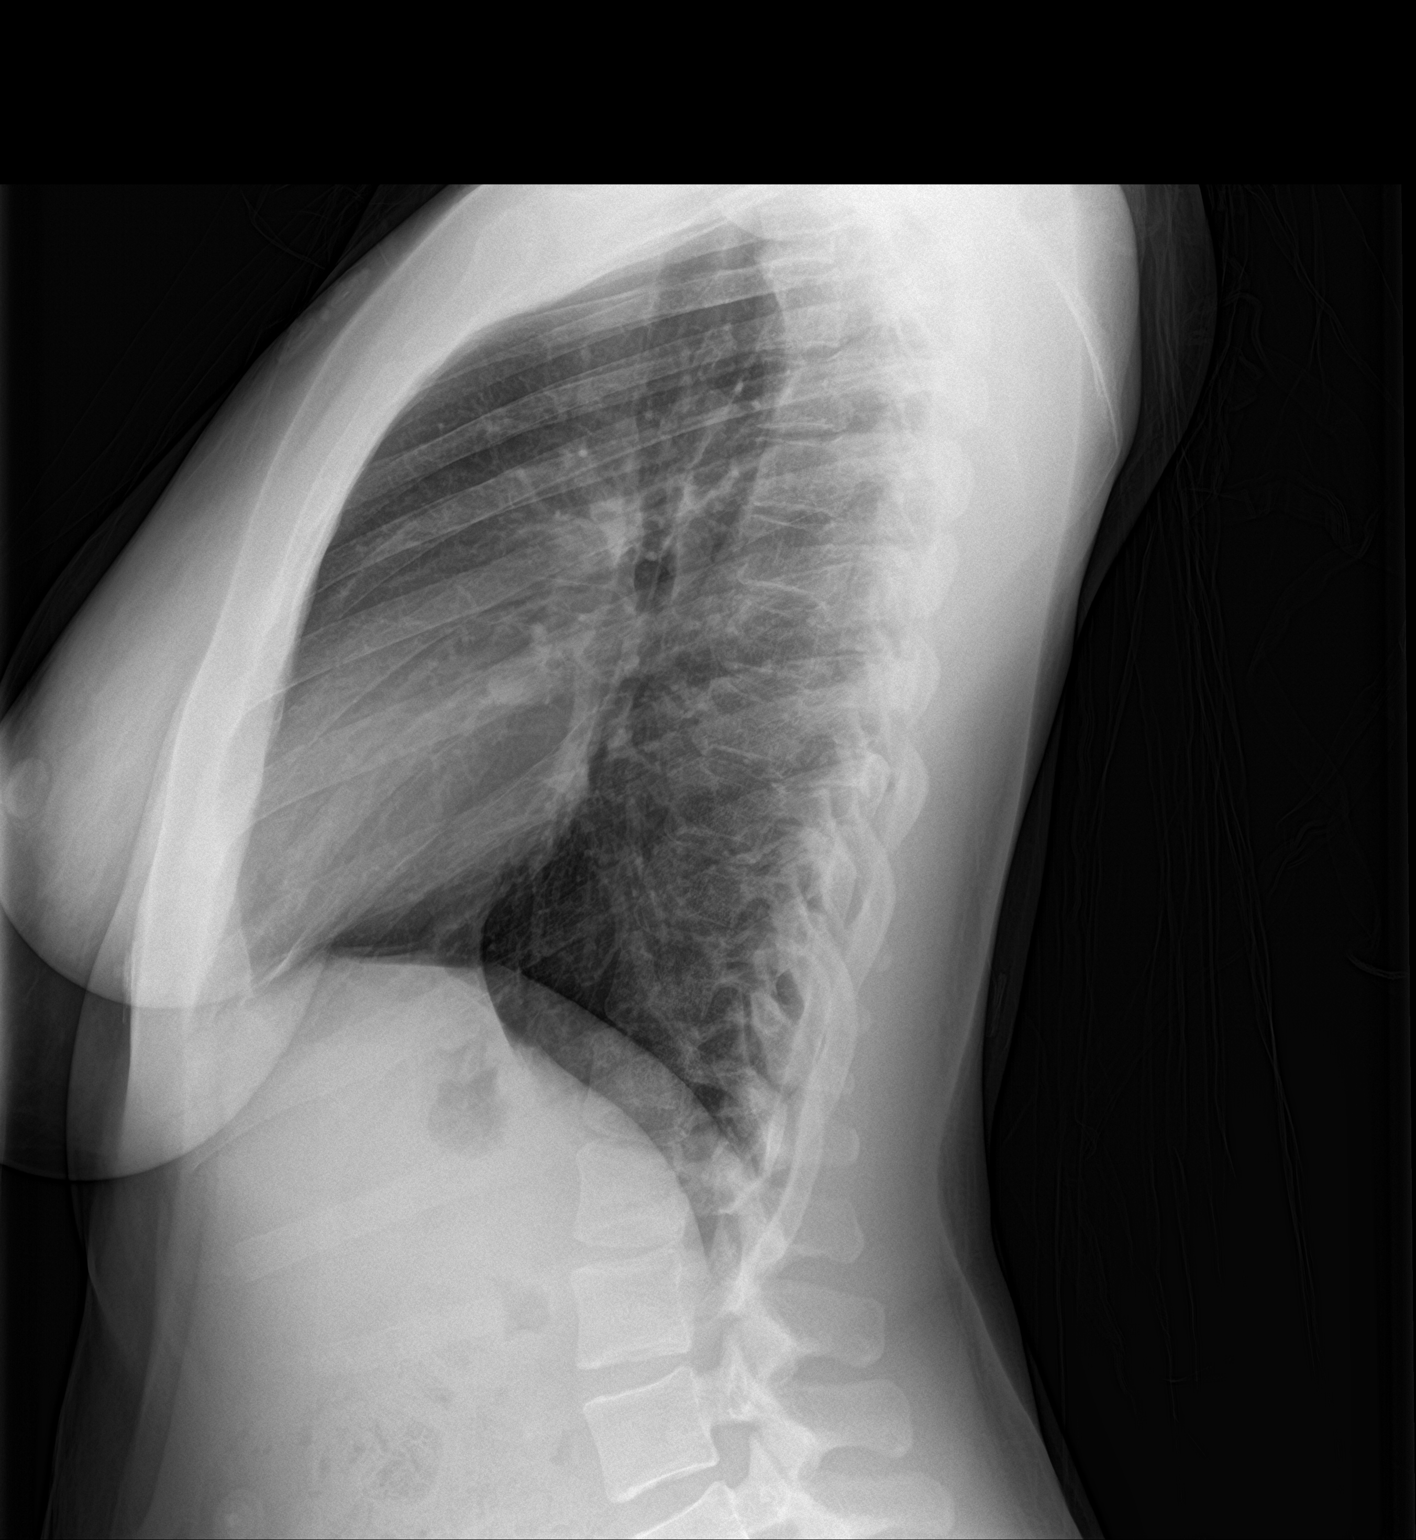

[2 of 2 positions shown; findings below may reference images not displayed]

FINDINGS: Trachea is midline. Cardiomediastinal contours and hilar structures
are normal. No signs of lobar consolidative process or interstitial
thickening. No evidence of pleural effusion.

Mild dextroconvex curvature of the lower thoracic spine is
unchanged.

On limited assessment there is no acute skeletal finding.
IMPRESSION: No active cardiopulmonary disease.

## 2021-04-12 MED ORDER — LIDOCAINE 5 % EX PTCH: 1.0000 | MEDICATED_PATCH | CUTANEOUS | 0 refills | Status: DC

## 2021-04-12 MED ORDER — CYCLOBENZAPRINE HCL 10 MG PO TABS: 10.0000 mg | ORAL_TABLET | Freq: Two times a day (BID) | ORAL | 0 refills | Status: DC | PRN

## 2021-04-12 NOTE — ED Provider Notes (Signed)
Emergency Medicine Provider Triage Evaluation Note  Melody Williams , a 30 y.o. female  was evaluated in triage.  Pt complains of central chest pain x 3-4 days. Her chest pain ranges from sharp to dull sensation.  Prior chest pain is worse with movement and with flatulence.  She has not tried any medications for his symptoms.  She denies shortness of breath, abdominal pain, nausea, vomiting, fever, chills, dysuria, hematuria.  Denies sick contacts.  Denies prior history of MI or stroke.  Review of Systems  Positive: Chest pain Negative: Shortness of breath, abdominal pain  Physical Exam  BP 103/77 (BP Location: Left Arm)   Pulse 61   Temp 98.2 F (36.8 C) (Oral)   Resp 18   SpO2 100%  Gen:   Awake, no distress   Resp:  Normal effort  MSK:   Moves extremities without difficulty   Medical Decision Making  Medically screening exam initiated at 1:12 PM.  Appropriate orders placed.  Melody Williams was informed that the remainder of the evaluation will be completed by another provider, this initial triage assessment does not replace that evaluation, and the importance of remaining in the ED until their evaluation is complete.     Navina Wohlers A, PA-C 04/12/21 1317    Gerhard Munch, MD 04/12/21 1422

## 2021-04-12 NOTE — ED Provider Notes (Signed)
[facility] EMERGENCY DEPARTMENT Provider Note   CSN: [id number] Arrival date & time: 04/12/21  1221     History Chief Complaint  Patient presents with   Chest Pain    Melody Williams is a 30 y.o. female.  The history is provided by the patient and medical records. No language interpreter was used.  Chest Pain Pain location:  L chest and L lateral chest Pain quality: aching   Pain radiates to:  Upper back Pain severity:  Moderate Timing:  Constant Progression:  Waxing and waning Chronicity:  New Relieved by:  Nothing Worsened by:  Movement and deep breathing Ineffective treatments:  None tried Associated symptoms: back pain and cough   Associated symptoms: no abdominal pain, no altered mental status, no diaphoresis, no dizziness, no fatigue, no fever, no headache, no lower extremity edema, no nausea, no palpitations, no shortness of breath and no vomiting   Risk factors: not female and no prior DVT/PE       Past Medical History:  Diagnosis Date   Acute meniscal tear of knee LEFT   Anxiety    Eczema    Eczema     Patient Active Problem List   Diagnosis Date Noted   History of prior pregnancy with IUGR newborn 01/17/2018   Encounter for surveillance of contraceptives 04/11/2014   Depression 01/01/2014    Past Surgical History:  Procedure Laterality Date   CESAREAN SECTION N/A 08/05/2012   Procedure: CESAREAN SECTION;  Surgeon: Adam Phenix, MD;  Location: [facility];  Service: Obstetrics;  Laterality: N/A;  Twins   KNEE ARTHROSCOPY  05/01/2011   Procedure: ARTHROSCOPY KNEE;  Surgeon: Javier Docker;  Location: [facility];  Service: Orthopedics;  Laterality: Left;  left knee arthroscopy with debridement ,removal of loose  bodies     OB History     Gravida  2   Para  2   Term  1   Preterm  1   AB  0   Living  3      SAB  0   IAB  0   Ectopic  0   Multiple  1   Live Births  3           Family History   Problem Relation Age of Onset   Healthy Mother    Healthy Father     Social History   Tobacco Use   Smoking status: Never   Smokeless tobacco: Never  Vaping Use   Vaping Use: Never used  Substance Use Topics   Alcohol use: No   Drug use: No    Home Medications Prior to Admission medications   Medication Sig Start Date End Date Taking? Authorizing Provider  fluticasone (FLONASE) 50 MCG/ACT nasal spray Place 1 spray into both nostrils daily as needed for allergies or rhinitis.  12/26/18   [provider]  hydrOXYzine (ATARAX/VISTARIL) 25 MG tablet Take 1 tablet (25 mg total) by mouth every 6 (six) hours. 04/24/19   Maxwell Caul, PA-C  ibuprofen (ADVIL) 200 MG tablet Take 200 mg by mouth every 6 (six) hours as needed for headache or mild pain.    [provider]  ibuprofen (ADVIL,MOTRIN) 600 MG tablet Take 1 tablet (600 mg total) by mouth every 6 (six) hours. Patient not taking: Reported on 04/24/2019 05/14/18   Myrene Buddy, MD  medroxyPROGESTERone (DEPO-PROVERA) 150 MG/ML injection Inject 1 mL (150 mg total) into the muscle every 3 (three) months. Patient not taking:  Reported on 04/24/2019 09/14/18   Brock Bad, MD  metroNIDAZOLE (FLAGYL) 500 MG tablet Take 1 tablet (500 mg total) by mouth 2 (two) times daily. Patient not taking: Reported on 09/14/2018 06/14/18   Adam Phenix, MD  naproxen (NAPROSYN) 375 MG tablet Take 1 tablet (375 mg total) by mouth 2 (two) times daily. 02/18/20   Linwood Dibbles, MD  ondansetron (ZOFRAN ODT) 8 MG disintegrating tablet Take 1 tablet (8 mg total) by mouth every 8 (eight) hours as needed for nausea or vomiting. 02/18/20   Linwood Dibbles, MD  ondansetron (ZOFRAN) 4 MG tablet Take 1 tablet (4 mg total) by mouth every 8 (eight) hours as needed for nausea or vomiting. 08/26/20   Lenn Sink, DPM  ondansetron (ZOFRAN) 4 MG tablet Take 1 tablet (4 mg total) by mouth every 8 (eight) hours as needed for nausea or vomiting. 12/23/20    Regal, Kirstie Peri, DPM  oxyCODONE-acetaminophen (PERCOCET) 10-325 MG tablet Take 1 tablet by mouth every 4 (four) hours as needed for pain. Patient not taking: Reported on 10/10/2020 08/26/20   Lenn Sink, DPM  oxyCODONE-acetaminophen (PERCOCET) 10-325 MG tablet Take 1 tablet by mouth every 4 (four) hours as needed for pain. 12/23/20   Lenn Sink, DPM    Allergies    Citrus and Cashew nut oil  Review of Systems   Review of Systems  Constitutional:  Negative for chills, diaphoresis, fatigue and fever.  HENT:  Negative for congestion.   Eyes:  Negative for visual disturbance.  Respiratory:  Positive for cough. Negative for chest tightness and shortness of breath.   Cardiovascular:  Positive for chest pain. Negative for palpitations and leg swelling.  Gastrointestinal:  Negative for abdominal pain, constipation, diarrhea, nausea and vomiting.  Genitourinary:  Negative for dysuria.  Musculoskeletal:  Positive for back pain and neck pain. Negative for neck stiffness.  Skin:  Negative for rash and wound.  Neurological:  Negative for dizziness, light-headedness and headaches.  Psychiatric/Behavioral:  Negative for agitation.   All other systems reviewed and are negative.  Physical Exam Updated Vital Signs BP 103/77 (BP Location: Left Arm)   Pulse 61   Temp 98.2 F (36.8 C) (Oral)   Resp 18   LMP 04/01/2021   SpO2 100%   Physical Exam Vitals and nursing note reviewed.  Constitutional:      General: She is not in acute distress.    Appearance: She is well-developed. She is not ill-appearing, toxic-appearing or diaphoretic.  HENT:     Head: Normocephalic and atraumatic.  Eyes:     Extraocular Movements: Extraocular movements intact.     Conjunctiva/sclera: Conjunctivae normal.     Pupils: Pupils are equal, round, and reactive to light.  Cardiovascular:     Rate and Rhythm: Normal rate and regular rhythm.     Heart sounds: Normal heart sounds. No murmur heard.    Comments:  Briefly tachy during my initial exam Pulmonary:     Effort: Pulmonary effort is normal. No respiratory distress.     Breath sounds: Normal breath sounds. No decreased breath sounds, wheezing, rhonchi or rales.  Chest:     Chest wall: Tenderness present.  Abdominal:     Palpations: Abdomen is soft.     Tenderness: There is no abdominal tenderness.  Musculoskeletal:        General: No swelling.     Cervical back: Neck supple.     Right lower leg: No tenderness. No edema.  Left lower leg: No tenderness. No edema.  Skin:    General: Skin is warm and dry.     Capillary Refill: Capillary refill takes less than 2 seconds.     Coloration: Skin is not pale.  Neurological:     General: No focal deficit present.     Mental Status: She is alert.  Psychiatric:        Mood and Affect: Mood normal.    ED Results / Procedures / Treatments   Labs (all labs ordered are listed, but only abnormal results are displayed) Labs Reviewed  RESP PANEL BY RT-PCR (FLU A&B, COVID) [id number]  BASIC METABOLIC PANEL  CBC  D-DIMER, QUANTITATIVE  I-STAT BETA HCG BLOOD, ED ([facility], [facility], [facility] ONLY)  TROPONIN I (HIGH SENSITIVITY)  TROPONIN I (HIGH SENSITIVITY)    EKG EKG Interpretation  Date/Time:  Saturday April 12 2021 13:00:56 EST Ventricular Rate:  63 PR Interval:  178 QRS Duration: 90 QT Interval:  420 QTC Calculation: 429 R Axis:   70 Text Interpretation: Normal sinus rhythm with sinus arrhythmia Normal ECG When compared to prior, similar appearance to prior. NO STEMI Confirmed by Theda Belfast ([id number]) on 04/12/2021 3:11:51 PM  Radiology DG Chest 2 View  Result Date: 04/12/2021 CLINICAL DATA:  Chest pain to the center of chest and bilateral shoulder pain for for 3-4 days. EXAM: CHEST - 2 VIEW COMPARISON:  April 24, 2019. FINDINGS: Trachea is midline. Cardiomediastinal contours and hilar structures are normal. No signs of lobar consolidative process or interstitial thickening. No evidence of  pleural effusion. Mild dextroconvex curvature of the lower thoracic spine is unchanged. On limited assessment there is no acute skeletal finding. IMPRESSION: No active cardiopulmonary disease. Electronically Signed   By: Donzetta Kohut M.D.   On: 04/12/2021 14:18    Procedures Procedures   Medications Ordered in ED Medications - No data to display  ED Course  I have reviewed the triage vital signs and the nursing notes.  Pertinent labs & imaging results that were available during my care of the patient were reviewed by me and considered in my medical decision making (see chart for details).    MDM Rules/Calculators/A&P                           Kaylany Tesoriero is a 30 y.o. female with a past medical history significant anxiety, eczema, and depression who presents with some URI symptoms and chest pain.  Patient reports that her pain is in the left chest wrapping around towards her back and her shoulders.  She says it also goes to her left neck.  She reports it does not go down the arm and is not exertional.  She reports it is worse with movement and some deep breathing.  She denies history of DVT or PE.  She denies any trauma.  She reports that she has recently been exposed to her husband who had COVID several weeks ago and has had some very mild URI symptoms.  She denies other nausea, vomiting, diaphoresis, constipation, diarrhea, or urinary changes.  No leg pain or leg swelling.  No other complaints reported.  Pain is moderate and she reports it feels like a cramping or spasm type pain.  On exam, lungs are clear.  Chest is tender anteriorly.  No murmur.  Good pulses in extremities.  Legs nontender nonedematous.  Patient does have some paraspinal tenderness in the left back and neck where there is some spasm.  No midline tenderness whatsoever.  Patient otherwise well-appearing.  X-ray did not show evidence of pneumonia and EKG showed no STEMI.  Patient had work-up to rule out other etiologies  of her discomfort.  Her troponin was negative x2 and a D-dimer was negative.  We did the D-dimer as at 1 point she was slightly tachycardic.  COVID and flu negative.  Suspect muscle spasm in the setting of some coughing.  Patient agrees and has remained stable for over 6 hours.  We feel she is safe for discharge home given her well appearance and reassuring vital signs.  Patient given prescription for muscle relaxant and Lidoderm patches and will follow-up with PCP.  She no questions or concerns and was discharged in good condition after reassuring work-up.   Final Clinical Impression(s) / ED Diagnoses Final diagnoses:  Atypical chest pain  Muscle spasm    Rx / DC Orders ED Discharge Orders          Ordered    cyclobenzaprine (FLEXERIL) 10 MG tablet  2 times daily PRN        04/12/21 1834    lidocaine (LIDODERM) 5 %  Every 24 hours        04/12/21 1834           Clinical Impression: 1. Atypical chest pain   2. Muscle spasm     Disposition: Discharge  Condition: Good  I have discussed the results, Dx and Tx plan with the pt(& family if present). He/she/they expressed understanding and agree(s) with the plan. Discharge instructions discussed at great length. Strict return precautions discussed and pt &/or family have verbalized understanding of the instructions. No further questions at time of discharge.    Discharge Medication List as of 04/12/2021  6:36 PM     START taking these medications   Details  cyclobenzaprine (FLEXERIL) 10 MG tablet Take 1 tablet (10 mg total) by mouth 2 (two) times daily as needed for muscle spasms., Starting Sat 04/12/2021, Print    lidocaine (LIDODERM) 5 % Place 1 patch onto the skin daily. Remove & Discard patch within 12 hours or as directed by MD, Starting Sat 04/12/2021, Print        Follow Up: [facility] [address] [place] [address] [phone] Schedule an appointment as  soon as possible for a visit    [facility] EMERGENCY DEPARTMENT [address] [id number] m[place] [address] [phone]       Rocky Rishel, Canary Brim, MD 04/12/21 [phone]

## 2021-04-12 NOTE — Discharge Instructions (Signed)
Your history, exam, work-up today are consistent with likely musculoskeletal chest wall pain with spasms which we felt on exam.  Your x-ray was reassuring and her EKG did not show any concerning findings.  Your heart enzymes were negative both times we checked them and your blood clot test was also negative.  The COVID and flu test were negative however as we discussed, you could also have viral upper respiratory symptoms not related to COVID or flu.  Given your reassuring work-up, feel you are safe for discharge home and please use the medicines to help with the muscle pains.  Please rest and stay hydrated.  Please follow-up with your regular doctor.  If any symptoms change or worsen acutely, please return to the nearest emergency department.

## 2021-04-12 NOTE — ED Triage Notes (Signed)
Pt reports pain to center of chest and bilateral shoulder pain x 3-4 days. Pain worse with movement.  Denies SOB,nausea, and vomiting.

## 2021-05-11 HISTORY — PX: FOOT SURGERY: SHX648

## 2021-07-10 ENCOUNTER — Encounter: Payer: Self-pay | Admitting: Podiatry

## 2021-07-10 ENCOUNTER — Other Ambulatory Visit: Payer: Self-pay

## 2021-07-10 ENCOUNTER — Ambulatory Visit (INDEPENDENT_AMBULATORY_CARE_PROVIDER_SITE_OTHER): Payer: Medicaid Other

## 2021-07-10 ENCOUNTER — Ambulatory Visit (INDEPENDENT_AMBULATORY_CARE_PROVIDER_SITE_OTHER): Payer: Medicaid Other | Admitting: Podiatry

## 2021-07-10 DIAGNOSIS — M7751 Other enthesopathy of right foot: Secondary | ICD-10-CM | POA: Diagnosis not present

## 2021-07-10 DIAGNOSIS — M21611 Bunion of right foot: Secondary | ICD-10-CM

## 2021-07-10 DIAGNOSIS — M21612 Bunion of left foot: Secondary | ICD-10-CM

## 2021-07-10 DIAGNOSIS — M21619 Bunion of unspecified foot: Secondary | ICD-10-CM

## 2021-07-10 MED ORDER — TRIAMCINOLONE ACETONIDE 10 MG/ML IJ SUSP
10.0000 mg | Freq: Once | INTRAMUSCULAR | Status: AC
  Administered 2021-07-10: 10 mg

## 2021-07-11 NOTE — Progress Notes (Signed)
Subjective:  ? ?Patient ID: Melody Williams, female   DOB: 31 y.o.   MRN: [id number]  ? ?HPI ?Patient presents stating that the right big toe joint has been hurting for several weeks and it is radiating somewhat to the top of the foot.  She is done very well with her surgery very pleased and wants to get the left 1 fixed at 1 point in future ? ? ?ROS ? ? ?   ?Objective:  ?Physical Exam  ?Neurovascular status intact with inflammation of the first MPJ right which may have been some kind of activity she did or possible subtle injury pattern ? ?   ?Assessment:  ?Inflammatory capsulitis of the first MPJ right ? ?   ?Plan:  ?Reviewed condition sterile prep and injected around the first MPJ 3 mg dexamethasone Kenalog 5 mg Xylocaine and advised on rigid bottom shoes and if symptoms persist will come back ? ?X-rays indicate that the fixation is in place the joint congruence excellent healing has occurred ?   ? ? ?

## 2021-09-04 ENCOUNTER — Other Ambulatory Visit: Payer: Self-pay | Admitting: Physician Assistant

## 2021-09-04 DIAGNOSIS — R102 Pelvic and perineal pain: Secondary | ICD-10-CM

## 2021-09-11 ENCOUNTER — Ambulatory Visit
Admission: RE | Admit: 2021-09-11 | Discharge: 2021-09-11 | Disposition: A | Discharge status: Home / Self Care | Payer: Medicaid Other | Source: Ambulatory Visit | Attending: Physician Assistant | Admitting: Physician Assistant

## 2021-09-11 DIAGNOSIS — R102 Pelvic and perineal pain: Secondary | ICD-10-CM

## 2021-09-11 IMAGING — US US PELVIS COMPLETE WITH TRANSVAGINAL
1 series · 14 of 25 positions shown · non-contrast
Comparison: None Available.

CLINICAL DATA: Vaginal pain

EXAM:
TRANSABDOMINAL AND TRANSVAGINAL ULTRASOUND OF PELVIS
TECHNIQUE: Both transabdominal and transvaginal ultrasound examinations of the
pelvis were performed. Transabdominal technique was performed for
global imaging of the pelvis including uterus, ovaries, adnexal
regions, and pelvic cul-de-sac. It was necessary to proceed with
endovaginal exam following the transabdominal exam to visualize the
endometrium and adnexa.

[Series 1: us pelvis complete with transvaginal · 0.26mm/px · 14 of 77 slices shown]
[im 1/77]
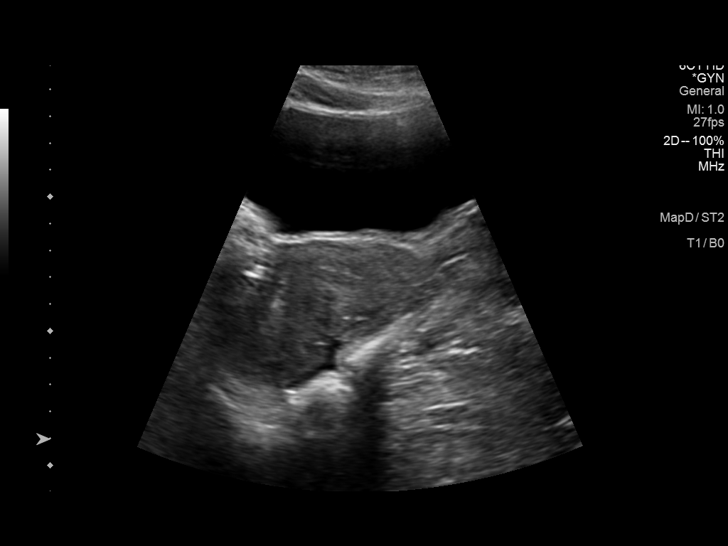
[im 7/77]
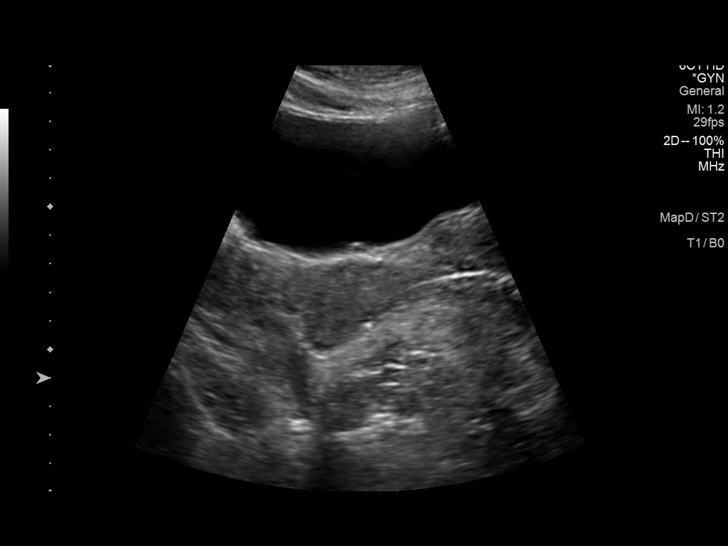
[im 13/77]
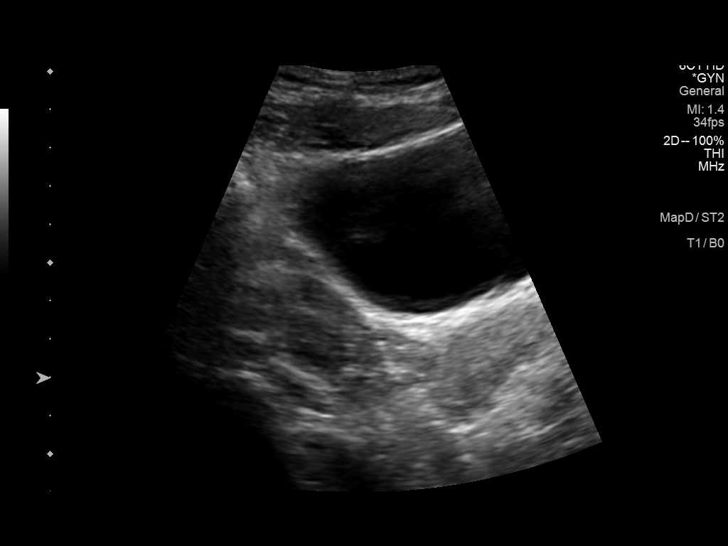
[im 20/77]
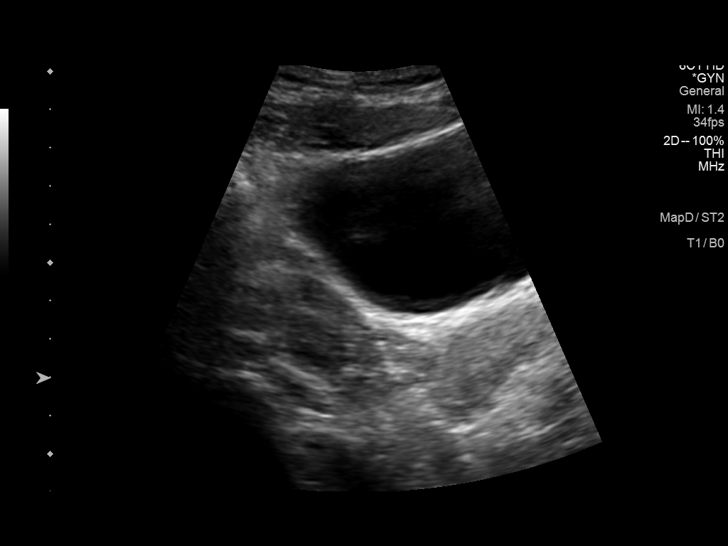
[im 26/77]
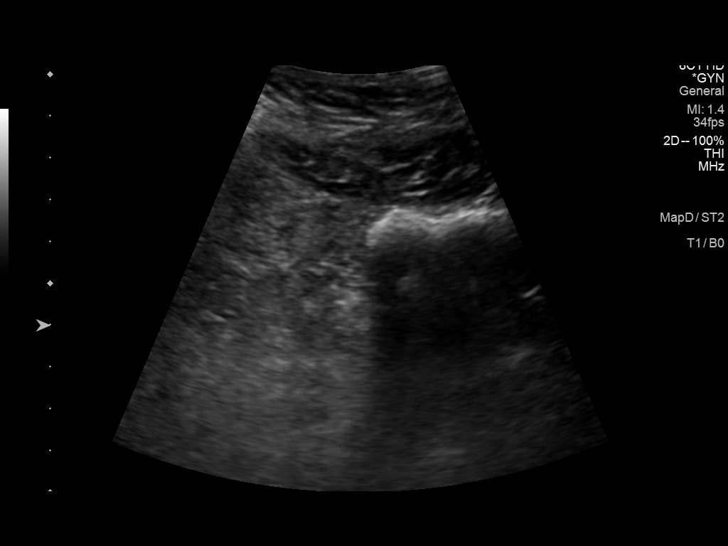
[im 29/77]
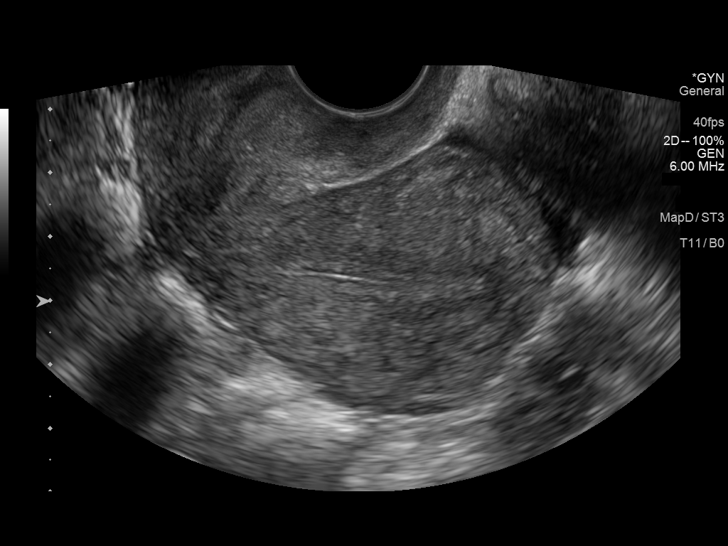
[im 35/77]
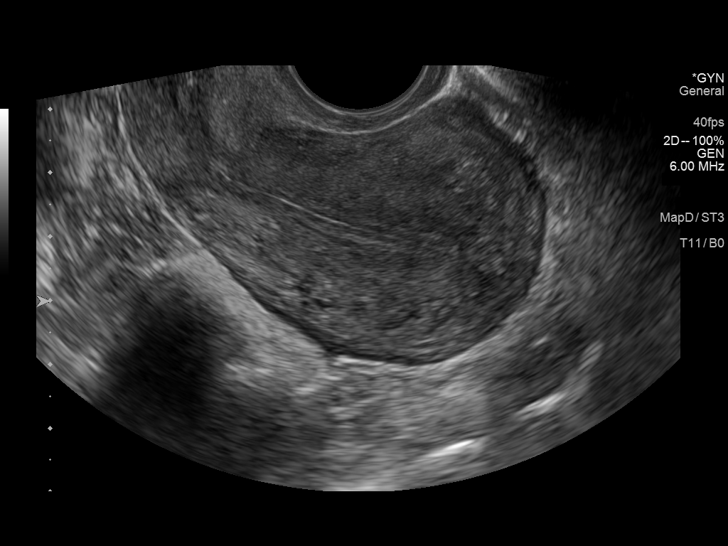
[im 42/77]
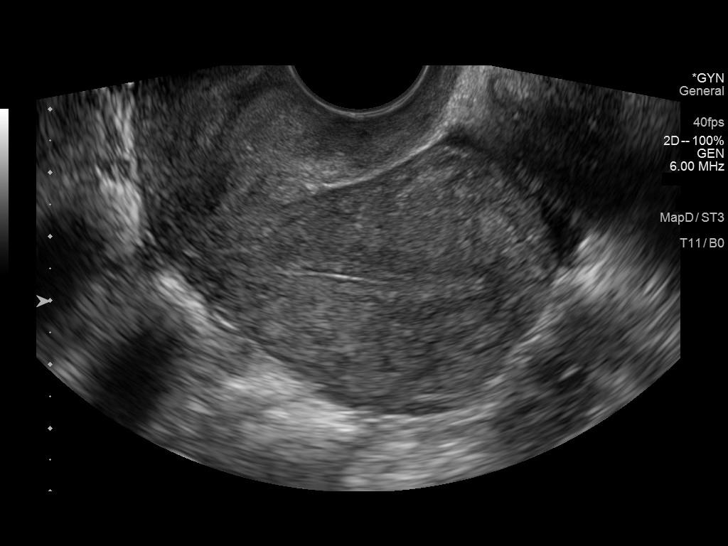
[im 48/77]
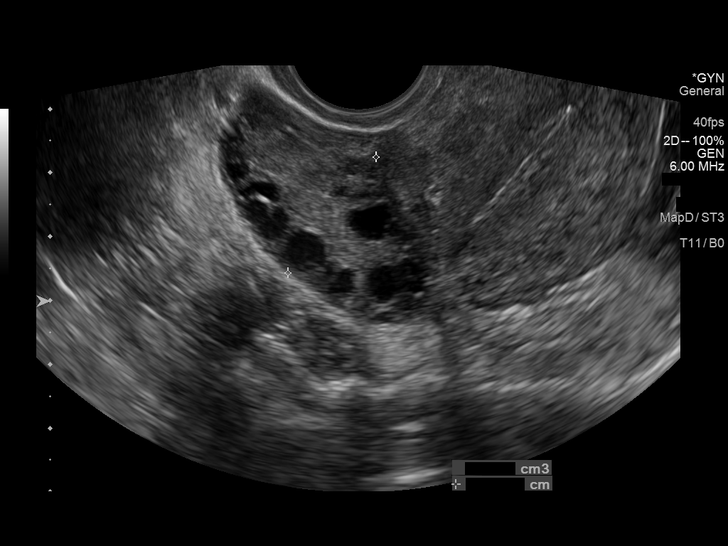
[im 51/77]
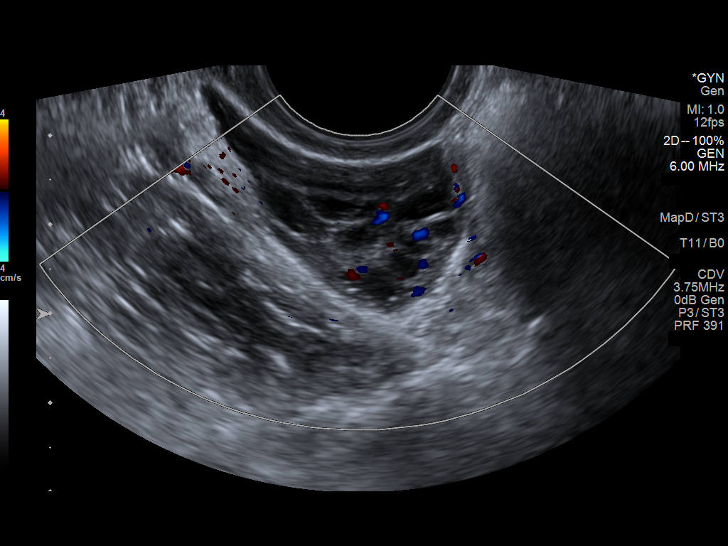
[im 58/77]
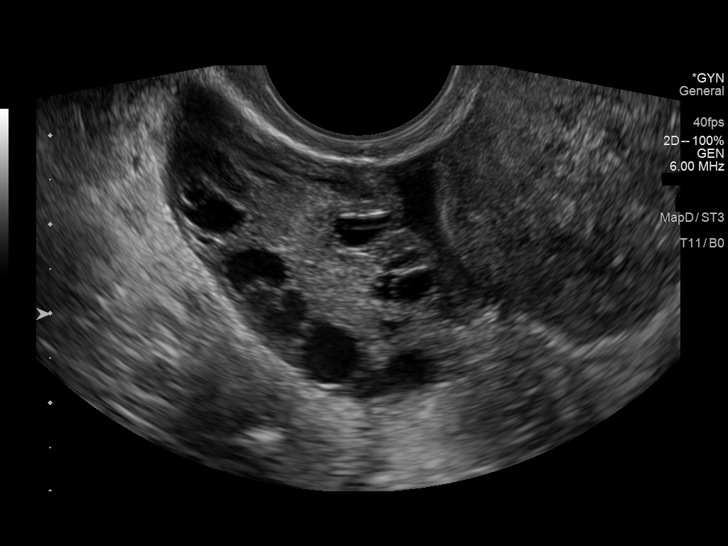
[im 64/77]
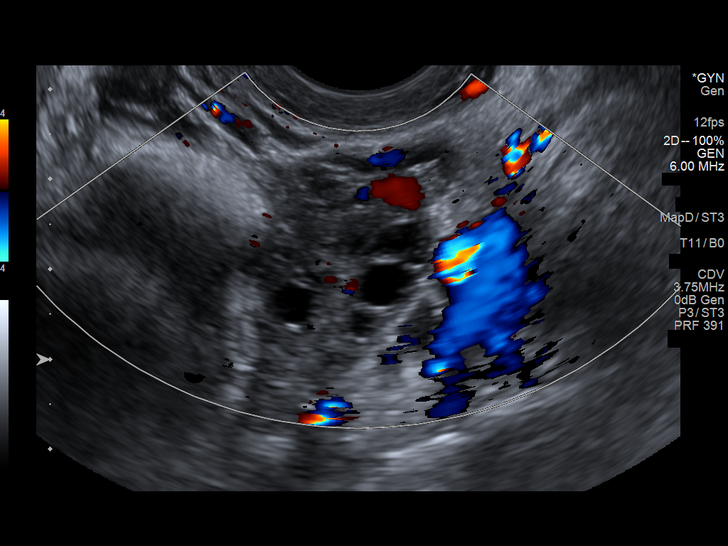
[im 70/77]
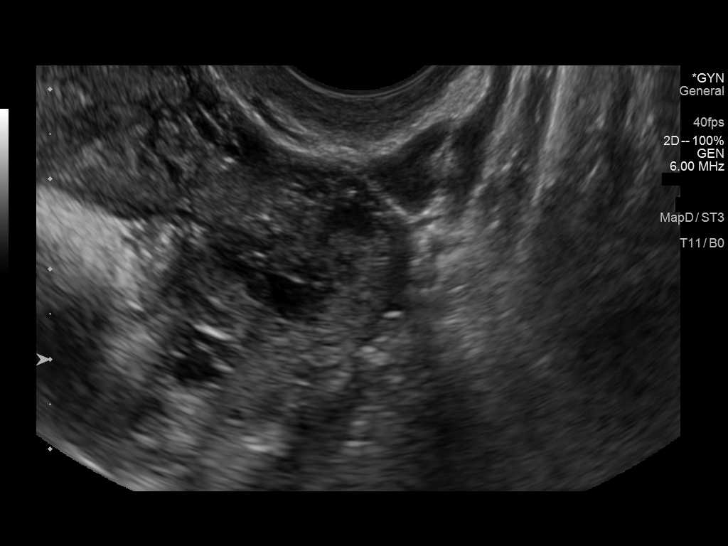
[im 77/77]
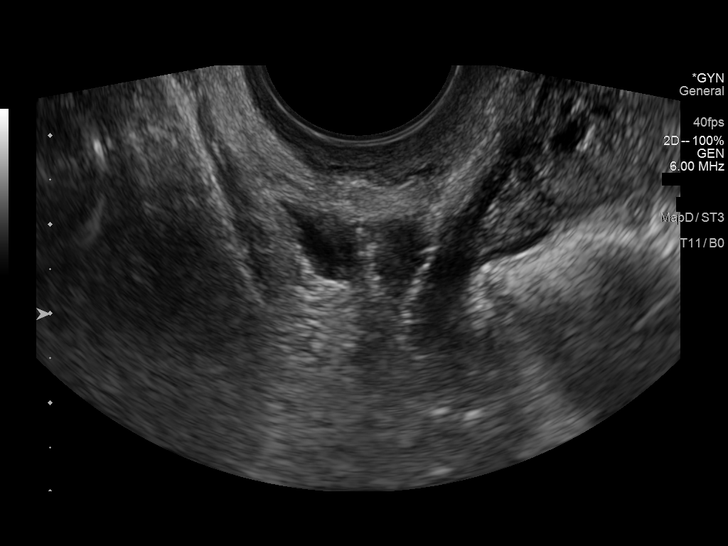

[14 of 25 positions shown; findings below may reference images not displayed]

FINDINGS: Uterus

Measurements: 6.6 x 4.2 x 4.4 cm = volume: 640 mL. No fibroids or
other mass visualized. Retroverted.

Endometrium

Thickness: 3 mm.  No focal abnormality visualized.

Right ovary

Measurements: 4.1 x 2.5 x 2.3 cm = volume: 12 mL. Multiple small
follicles.

Left ovary

Measurements: 3.1 x 1.6 x 2.3 cm = volume: 6 mL. Multiple small
follicles.

Other findings

Trace free fluid most likely physiologic.
IMPRESSION: No acute process or suspicious mass identified in the pelvis.

## 2021-09-18 ENCOUNTER — Ambulatory Visit: Payer: Medicaid Other | Admitting: Podiatry

## 2021-09-24 ENCOUNTER — Ambulatory Visit: Payer: Medicaid Other | Admitting: Podiatry

## 2021-12-23 ENCOUNTER — Emergency Department (HOSPITAL_COMMUNITY): Payer: Medicaid Other

## 2021-12-23 ENCOUNTER — Emergency Department (HOSPITAL_COMMUNITY)
Admission: EM | Admit: 2021-12-23 | Discharge: 2021-12-23 | Discharge status: Against Medical Advice | Payer: Medicaid Other | Attending: Emergency Medicine | Admitting: Emergency Medicine

## 2021-12-23 ENCOUNTER — Other Ambulatory Visit: Payer: Self-pay

## 2021-12-23 ENCOUNTER — Encounter (HOSPITAL_COMMUNITY): Payer: Self-pay

## 2021-12-23 DIAGNOSIS — R079 Chest pain, unspecified: Secondary | ICD-10-CM | POA: Diagnosis not present

## 2021-12-23 DIAGNOSIS — R519 Headache, unspecified: Secondary | ICD-10-CM | POA: Insufficient documentation

## 2021-12-23 DIAGNOSIS — Z5321 Procedure and treatment not carried out due to patient leaving prior to being seen by health care provider: Secondary | ICD-10-CM | POA: Insufficient documentation

## 2021-12-23 DIAGNOSIS — R55 Syncope and collapse: Secondary | ICD-10-CM | POA: Insufficient documentation

## 2021-12-23 LAB — URINALYSIS, ROUTINE W REFLEX MICROSCOPIC
Bilirubin Urine: NEGATIVE
Glucose, UA: NEGATIVE mg/dL
Hgb urine dipstick: NEGATIVE
Ketones, ur: NEGATIVE mg/dL
Leukocytes,Ua: NEGATIVE
Nitrite: NEGATIVE
Protein, ur: NEGATIVE mg/dL
Specific Gravity, Urine: 1.015 (ref 1.005–1.030)
pH: 5 (ref 5.0–8.0)

## 2021-12-23 LAB — BASIC METABOLIC PANEL
Anion gap: 5 (ref 5–15)
BUN: 13 mg/dL (ref 6–20)
CO2: 24 mmol/L (ref 22–32)
Calcium: 9.3 mg/dL (ref 8.9–10.3)
Chloride: 109 mmol/L (ref 98–111)
Creatinine, Ser: 0.85 mg/dL (ref 0.44–1.00)
GFR, Estimated: >60 mL/min (ref >60)
Glucose, Bld: 76 mg/dL (ref 70–99)
Potassium: 3.7 mmol/L (ref 3.5–5.1)
Sodium: 138 mmol/L (ref 135–145)

## 2021-12-23 LAB — CBC
HCT: 39.5 % (ref 36.0–46.0)
Hemoglobin: 13 g/dL (ref 12.0–15.0)
MCH: 31.3 pg (ref 26.0–34.0)
MCHC: 32.9 g/dL (ref 30.0–36.0)
MCV: 95 fL (ref 80.0–100.0)
Platelets: 224 10*3/uL (ref 150–400)
RBC: 4.16 MIL/uL (ref 3.87–5.11)
RDW: 12.1 % (ref 11.5–15.5)
WBC: 8.1 10*3/uL (ref 4.0–10.5)
nRBC: 0 % (ref 0.0–0.2)

## 2021-12-23 LAB — I-STAT BETA HCG BLOOD, ED (MC, WL, AP ONLY): I-stat hCG, quantitative: <5 m[IU]/mL (ref <5)

## 2021-12-23 LAB — TROPONIN I (HIGH SENSITIVITY): Troponin I (High Sensitivity): <2 ng/L (ref <18)

## 2021-12-23 NOTE — ED Provider Triage Note (Signed)
Emergency Medicine Provider Triage Evaluation Note  Shahana Capes , a 31 y.o. female  was evaluated in triage.  Pt complains of syncopal episode that happened at work.  Patient works at [employer] and was taken in order when this happened.  Denies seizure-like activity.  Reports headache.  Reports feeling "fuzzy" in the brain, hot prior to the episode.  Denies chest pain prior.  Reports chest pain since arriving to the emergency room.  Denies palpitations, shortness of breath.  States it was humid in [employer].  Does report headache.  Review of Systems  Positive: As above Negative: As above  Physical Exam  BP 108/80 (BP Location: Left Arm)   Pulse 67   Temp 98 F (36.7 C) (Oral)   Resp 16   SpO2 99%  Gen:   Awake, no distress   Resp:  Normal effort  MSK:   Moves extremities without difficulty Other:    Medical Decision Making  Medically screening exam initiated at 1:27 PM.  Appropriate orders placed.  Storey Stangeland was informed that the remainder of the evaluation will be completed by another provider, this initial triage assessment does not replace that evaluation, and the importance of remaining in the ED until their evaluation is complete.     Marita Kansas, PA-C 12/23/21 1329

## 2021-12-23 NOTE — ED Triage Notes (Signed)
Per EMS, patient from work, c/o fatigue today. States coworkers report that she passed out. C/o neck pain. Ambulatory.   BP 110/70 RR 18 99% RA HR 80

## 2021-12-30 ENCOUNTER — Encounter: Payer: Self-pay | Admitting: Internal Medicine

## 2021-12-30 ENCOUNTER — Ambulatory Visit: Payer: Medicaid Other | Admitting: Internal Medicine

## 2021-12-30 VITALS — Temp 98.0°F | Resp 16 | Ht 64.0 in | Wt 147.0 lb

## 2021-12-30 DIAGNOSIS — R42 Dizziness and giddiness: Secondary | ICD-10-CM | POA: Insufficient documentation

## 2021-12-30 DIAGNOSIS — I517 Cardiomegaly: Secondary | ICD-10-CM

## 2021-12-30 DIAGNOSIS — R55 Syncope and collapse: Secondary | ICD-10-CM

## 2021-12-30 DIAGNOSIS — R002 Palpitations: Secondary | ICD-10-CM | POA: Insufficient documentation

## 2021-12-30 NOTE — Progress Notes (Signed)
Primary Physician/Referring:  Norm Salt, PA  Patient ID: Melody Williams, female    DOB: 1990-10-27, 31 y.o.   MRN: [id number]  Chief Complaint  Patient presents with   Near Syncope   Palpitations   New Patient (Initial Visit)   HPI:    Melody Williams  is a 31 y.o. female with no reported past medical history who is here to establish care with cardiology. She has been having severe palpitations and recently syncope. She gave birth to her son about 3 years ago and she recalls feeling some palpitations then. She has never had an echo before. She has not worn an event monitor but has been told that she has abnormal EKGs. Her recent syncopal episodes are very much concerning her. Patient does not smoke or drink alcohol. No family history of early heart disease. She denies having pre-eclampsia or peripartum cardiomyopathy. Today she denies chest pain, shortness of breath, diaphoresis, edema, PND, orthopnea.   Past Medical History:  Diagnosis Date   Acute meniscal tear of knee LEFT   Anxiety    Eczema    Eczema    Past Surgical History:  Procedure Laterality Date   CESAREAN SECTION N/A 08/05/2012   Procedure: CESAREAN SECTION;  Surgeon: Adam Phenix, MD;  Location: [facility];  Service: Obstetrics;  Laterality: N/A;  Twins   KNEE ARTHROSCOPY  05/01/2011   Procedure: ARTHROSCOPY KNEE;  Surgeon: Javier Docker;  Location: [facility];  Service: Orthopedics;  Laterality: Left;  left knee arthroscopy with debridement ,removal of loose  bodies   Family History  Problem Relation Age of Onset   Healthy Mother    Healthy Father     Social History   Tobacco Use   Smoking status: Never   Smokeless tobacco: Never  Substance Use Topics   Alcohol use: No   Marital Status: Single  ROS  Review of Systems  Cardiovascular:  Positive for irregular heartbeat, near-syncope, palpitations and syncope.  Neurological:  Positive for light-headedness.  All other systems reviewed  and are negative. Objective  Temperature 98 F (36.7 C), resp. rate 16, height 5\' 4"  (1.626 m), weight 147 lb (66.7 kg), last menstrual period 12/05/2021, SpO2 100 %, unknown if currently breastfeeding. Body mass index is 25.23 kg/m.     12/30/2021    2:22 PM 12/23/2021    3:29 PM 12/23/2021    1:20 PM  Vitals with BMI  Height 5\' 4"     Weight 147 lbs    BMI 25.22    Systolic  102 108  Diastolic  66 80  Pulse  54 67     Physical Exam Vitals reviewed.  Constitutional:      Appearance: Normal appearance. She is normal weight.  HENT:     Head: Normocephalic and atraumatic.  Neck:     Vascular: No carotid bruit.  Cardiovascular:     Rate and Rhythm: Normal rate and regular rhythm.     Pulses: Normal pulses.     Heart sounds: Normal heart sounds. No murmur heard.    No friction rub. No gallop.  Pulmonary:     Effort: Pulmonary effort is normal.     Breath sounds: Normal breath sounds.  Abdominal:     General: Abdomen is flat. Bowel sounds are normal.     Palpations: Abdomen is soft.  Musculoskeletal:     Right lower leg: No edema.     Left lower leg: No edema.  Skin:    General:  Skin is warm and dry.  Neurological:     Mental Status: She is alert.   Medications and allergies   Allergies  Allergen Reactions   Citrus Rash    Watermelon   Cashew Nut Oil Itching and Nausea And Vomiting    Severe itching in mouth     Medication list after today's encounter  No current outpatient medications on file.  Current Facility-Administered Medications:    medroxyPROGESTERone (DEPO-PROVERA) injection 150 mg, 150 mg, Intramuscular, Q90 days, Brock Bad, MD, 150 mg at 09/14/18 1440  Laboratory examination:   Lab Results  Component Value Date   NA 138 12/23/2021   K 3.7 12/23/2021   CO2 24 12/23/2021   GLUCOSE 76 12/23/2021   BUN 13 12/23/2021   CREATININE 0.85 12/23/2021   CALCIUM 9.3 12/23/2021   GFRNONAA >60 12/23/2021       Latest Ref Rng & Units 12/23/2021     1:57 PM 04/12/2021    1:16 PM 02/18/2020    3:03 PM  CMP  Glucose 70 - 99 mg/dL 76  85  76   BUN 6 - 20 mg/dL 13  12  6    Creatinine 0.44 - 1.00 mg/dL  6.96  2.95   Sodium 135 - 145 mmol/L 138  139  139   Potassium 3.5 - 5.1 mmol/L 3.7  4.7  3.7   Chloride 98 - 111 mmol/L 109  109  102   CO2 22 - 32 mmol/L 24  25    Calcium 8.9 - 10.3 mg/dL 9.3  9.3        Latest Ref Rng & Units 12/23/2021    1:57 PM 04/12/2021    1:16 PM 02/18/2020    3:03 PM  CBC  WBC 4.0 - 10.5 K/uL 8.1  8.0    Hemoglobin 12.0 - 15.0 g/dL 04/19/2020  13.2  44.0   Hematocrit 36.0 - 46.0 % 39.5  39.0  41.0   Platelets 150 - 400 K/uL 224  298      Lipid Panel No results for input(s): "CHOL", "TRIG", "LDLCALC", "VLDL", "HDL", "CHOLHDL", "LDLDIRECT" in the last 8760 hours.  HEMOGLOBIN A1C No results found for: "HGBA1C", "MPG" TSH No results for input(s): "TSH" in the last 8760 hours.  External labs:     Radiology:    Cardiac Studies:   No results found for this or any previous visit from the past 1095 days.     No results found for this or any previous visit from the past 1095 days.     EKG:   12/30/21 NSR, borderline LAE    Assessment     ICD-10-CM   1. Palpitations  R00.2 EKG 12-Lead    2. Syncope and collapse  R55     3. Light headedness  R42     4. Mild atrial enlargement, left  I51.7        Orders Placed This Encounter  Procedures   EKG 12-Lead    No orders of the defined types were placed in this encounter.   Medications Discontinued During This Encounter  Medication Reason   cyclobenzaprine (FLEXERIL) 10 MG tablet    fluticasone (FLONASE) 50 MCG/ACT nasal spray    hydrOXYzine (ATARAX/VISTARIL) 25 MG tablet    ibuprofen (ADVIL) 200 MG tablet    ibuprofen (ADVIL,MOTRIN) 600 MG tablet    lidocaine (LIDODERM) 5 %    medroxyPROGESTERone (DEPO-PROVERA) 150 MG/ML injection    metroNIDAZOLE (FLAGYL) 500 MG tablet    naproxen (  NAPROSYN) 375 MG tablet    ondansetron  (ZOFRAN ODT) 8 MG disintegrating tablet    ondansetron (ZOFRAN) 4 MG tablet    ondansetron (ZOFRAN) 4 MG tablet    oxyCODONE-acetaminophen (PERCOCET) 10-325 MG tablet    oxyCODONE-acetaminophen (PERCOCET) 10-325 MG tablet      Recommendations:   Melody Williams is a 31 y.o.  female with palpitations, abnormal EKG, and syncope  EKG today and priors reviewed Labs reviewed, all within normal limits Schedule imaging tests in office - echo. Will set up for long term event monitor. Encourage adequate hydration. Avoid alcohol and other sedatives. Follow-up in 1-2 months or sooner if needed.     Melody Flock, DO, [facility]  12/30/2021, 2:34 PM Office: [phone] Pager: [phone]

## 2022-02-10 ENCOUNTER — Telehealth: Payer: Self-pay | Admitting: Internal Medicine

## 2022-02-10 ENCOUNTER — Ambulatory Visit: Payer: Medicaid Other | Admitting: Internal Medicine

## 2022-02-10 ENCOUNTER — Ambulatory Visit: Payer: Medicaid Other

## 2022-02-10 NOTE — Telephone Encounter (Signed)
Spoke to pt today regarding missed appt, she stated she would call back to r/s

## 2022-10-12 ENCOUNTER — Ambulatory Visit: Payer: Medicaid Other | Admitting: Podiatry

## 2022-10-14 ENCOUNTER — Ambulatory Visit: Payer: Medicaid Other | Admitting: Podiatry

## 2022-11-17 ENCOUNTER — Emergency Department (HOSPITAL_COMMUNITY)
Admission: EM | Admit: 2022-11-17 | Discharge: 2022-11-17 | Disposition: A | Discharge status: Home / Self Care | Payer: Medicaid Other | Attending: Emergency Medicine | Admitting: Emergency Medicine

## 2022-11-17 ENCOUNTER — Emergency Department (HOSPITAL_COMMUNITY): Payer: Medicaid Other

## 2022-11-17 ENCOUNTER — Other Ambulatory Visit: Payer: Self-pay

## 2022-11-17 DIAGNOSIS — M79641 Pain in right hand: Secondary | ICD-10-CM | POA: Diagnosis not present

## 2022-11-17 DIAGNOSIS — W01198A Fall on same level from slipping, tripping and stumbling with subsequent striking against other object, initial encounter: Secondary | ICD-10-CM | POA: Diagnosis not present

## 2022-11-17 DIAGNOSIS — M25551 Pain in right hip: Secondary | ICD-10-CM | POA: Insufficient documentation

## 2022-11-17 LAB — COMPREHENSIVE METABOLIC PANEL
ALT: 23 U/L (ref 0–44)
AST: 27 U/L (ref 15–41)
Albumin: 2.8 g/dL — ABNORMAL LOW (ref 3.5–5.0)
Alkaline Phosphatase: 33 U/L — ABNORMAL LOW (ref 38–126)
Anion gap: 6 (ref 5–15)
BUN: 10 mg/dL (ref 6–20)
CO2: 22 mmol/L (ref 22–32)
Calcium: 8.2 mg/dL — ABNORMAL LOW (ref 8.9–10.3)
Chloride: 110 mmol/L (ref 98–111)
Creatinine, Ser: 0.82 mg/dL (ref 0.44–1.00)
GFR, Estimated: >60 mL/min (ref >60)
Glucose, Bld: 80 mg/dL (ref 70–99)
Potassium: 3.7 mmol/L (ref 3.5–5.1)
Sodium: 138 mmol/L (ref 135–145)
Total Bilirubin: 0.6 mg/dL (ref 0.3–1.2)
Total Protein: 5 g/dL — ABNORMAL LOW (ref 6.5–8.1)

## 2022-11-17 LAB — URINALYSIS, ROUTINE W REFLEX MICROSCOPIC
Bilirubin Urine: NEGATIVE
Glucose, UA: NEGATIVE mg/dL
Ketones, ur: NEGATIVE mg/dL
Nitrite: NEGATIVE
Protein, ur: NEGATIVE mg/dL
RBC / HPF: >50 RBC/hpf (ref 0–5)
Specific Gravity, Urine: 1.009 (ref 1.005–1.030)
pH: 6 (ref 5.0–8.0)

## 2022-11-17 LAB — CBC
HCT: 39.7 % (ref 36.0–46.0)
Hemoglobin: 13.3 g/dL (ref 12.0–15.0)
MCH: 31.1 pg (ref 26.0–34.0)
MCHC: 33.5 g/dL (ref 30.0–36.0)
MCV: 92.8 fL (ref 80.0–100.0)
Platelets: 275 10*3/uL (ref 150–400)
RBC: 4.28 MIL/uL (ref 3.87–5.11)
RDW: 12.1 % (ref 11.5–15.5)
WBC: 13.7 10*3/uL — ABNORMAL HIGH (ref 4.0–10.5)
nRBC: 0 % (ref 0.0–0.2)

## 2022-11-17 LAB — I-STAT CHEM 8, ED
BUN: 12 mg/dL (ref 6–20)
Calcium, Ion: 1.13 mmol/L — ABNORMAL LOW (ref 1.15–1.40)
Chloride: 107 mmol/L (ref 98–111)
Creatinine, Ser: 0.9 mg/dL (ref 0.44–1.00)
Glucose, Bld: 77 mg/dL (ref 70–99)
HCT: 41 % (ref 36.0–46.0)
Hemoglobin: 13.9 g/dL (ref 12.0–15.0)
Potassium: 3.7 mmol/L (ref 3.5–5.1)
Sodium: 140 mmol/L (ref 135–145)
TCO2: 22 mmol/L (ref 22–32)

## 2022-11-17 LAB — SAMPLE TO BLOOD BANK

## 2022-11-17 LAB — ETHANOL: Alcohol, Ethyl (B): <10 mg/dL (ref <10)

## 2022-11-17 LAB — LACTIC ACID, PLASMA: Lactic Acid, Venous: 0.7 mmol/L (ref 0.5–1.9)

## 2022-11-17 LAB — HCG, SERUM, QUALITATIVE: Preg, Serum: NEGATIVE

## 2022-11-17 MED ORDER — FENTANYL CITRATE PF 50 MCG/ML IJ SOSY: 50.0000 ug | PREFILLED_SYRINGE | INTRAMUSCULAR | Status: DC | PRN

## 2022-11-17 MED ORDER — IOHEXOL 350 MG/ML SOLN
75.0000 mL | Freq: Once | INTRAVENOUS | Status: AC | PRN
  Administered 2022-11-17: 75 mL via INTRAVENOUS

## 2022-11-17 MED ORDER — SODIUM CHLORIDE 0.9 % IV BOLUS
1000.0000 mL | Freq: Once | INTRAVENOUS | Status: AC
  Administered 2022-11-17: 1000 mL via INTRAVENOUS

## 2022-11-17 MED ORDER — FENTANYL CITRATE PF 50 MCG/ML IJ SOSY
PREFILLED_SYRINGE | INTRAMUSCULAR | Status: AC
  Filled 2022-11-17: qty 1

## 2022-11-17 MED ORDER — IBUPROFEN 400 MG PO TABS
600.0000 mg | ORAL_TABLET | Freq: Once | ORAL | Status: AC
  Administered 2022-11-17: 600 mg via ORAL
  Filled 2022-11-17: qty 1
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
## 2022-11-17 NOTE — ED Provider Notes (Signed)
  [place] EMERGENCY DEPARTMENT AT [facility] Provider Note   CSN: [id number] Arrival date & time: 11/17/22  1440     History {Add pertinent medical, surgical, social history, OB history to HPI:1} No chief complaint on file.   Melody Williams is a 32 y.o. female.  HPI     Home Medications Prior to Admission medications   Not on File      Allergies    Citrus and Cashew nut oil    Review of Systems   Review of Systems  Physical Exam Updated Vital Signs BP (!) 84/52 (BP Location: Left Arm)   Pulse (!) 53   Temp 99.1 F (37.3 C)   Resp 16   SpO2 100%  Physical Exam  ED Results / Procedures / Treatments   Labs (all labs ordered are listed, but only abnormal results are displayed) Labs Reviewed - No data to display  EKG None  Radiology No results found.  Procedures Procedures  {Document cardiac monitor, telemetry assessment procedure when appropriate:1}  Medications Ordered in ED Medications - No data to display  ED Course/ Medical Decision Making/ A&P   {   Click here for ABCD2, HEART and other calculatorsREFRESH Note before signing :1}                          Medical Decision Making  ***  {Document critical care time when appropriate:1} {Document review of labs and clinical decision tools ie heart score, Chads2Vasc2 etc:1}  {Document your independent review of radiology images, and any outside records:1} {Document your discussion with family members, caretakers, and with consultants:1} {Document social determinants of health affecting pt's care:1} {Document your decision making why or why not admission, treatments were needed:1} Final Clinical Impression(s) / ED Diagnoses Final diagnoses:  None    Rx / DC Orders ED Discharge Orders     None

## 2022-11-17 NOTE — Discharge Instructions (Addendum)
Return for any problem.   Take ibuprofen  -600 mg every 8 hours -with food, as instructed for pain.

## 2022-11-17 NOTE — Progress Notes (Signed)
Orthopedic Tech Progress Note Patient Details:  Melody Williams [birth year] [id number]  Level 2 trauma   Patient ID: Melody Williams, female   DOB: 01-15-1991, 32 y.o.   MRN: [id number]  Melody Williams 11/17/2022, 3:13 PM

## 2023-01-26 NOTE — Progress Notes (Signed)
No show

## 2023-04-25 ENCOUNTER — Emergency Department (HOSPITAL_COMMUNITY)
Admission: EM | Admit: 2023-04-25 | Discharge: 2023-04-25 | Disposition: A | Discharge status: Home / Self Care | Payer: 59 | Attending: Emergency Medicine | Admitting: Emergency Medicine

## 2023-04-25 ENCOUNTER — Encounter (HOSPITAL_COMMUNITY): Payer: Self-pay | Admitting: Emergency Medicine

## 2023-04-25 DIAGNOSIS — Z1152 Encounter for screening for COVID-19: Secondary | ICD-10-CM | POA: Diagnosis not present

## 2023-04-25 DIAGNOSIS — B349 Viral infection, unspecified: Secondary | ICD-10-CM | POA: Diagnosis not present

## 2023-04-25 DIAGNOSIS — R0981 Nasal congestion: Secondary | ICD-10-CM | POA: Diagnosis present

## 2023-04-25 LAB — RESP PANEL BY RT-PCR (RSV, FLU A&B, COVID)  RVPGX2
Influenza A by PCR: NEGATIVE
Influenza B by PCR: NEGATIVE
Resp Syncytial Virus by PCR: NEGATIVE
SARS Coronavirus 2 by RT PCR: NEGATIVE

## 2023-04-25 NOTE — ED Triage Notes (Signed)
PT reports flu like symptoms for 5 days increasing last few days. Ear pain in left ear more intense but both are painful. EMS gave tylenol 650 in route. Now afebrile. Nasal congestion, cough, sneezing, body aches.

## 2023-04-25 NOTE — Discharge Instructions (Signed)
It was a pleasure taking part in your care.  As we discussed, your viral swab is negative for all.  You most likely have a URI.  Please continue taking Tylenol for fevers at home, ibuprofen for muscle aches and pains.  Please push fluids to include Pedialyte or electrolyte supplementation.  Please take Zicam to help shorten duration of your symptoms.  Return to the ED with any new or worsening symptoms.

## 2023-04-25 NOTE — ED Provider Notes (Signed)
[place] EMERGENCY DEPARTMENT AT [facility] Provider Note   CSN: [id number] Arrival date & time: 04/25/23  1929     History  Chief Complaint  Patient presents with   Generalized Body Aches    Melody Williams is a 32 y.o. female with medical history of anxiety and eczema.  The patient presents to the ED for evaluation of URI symptoms.  Patient reports that for the last 5 days she has had increasing URI symptoms to include bilateral ear pain, left greater than right, nasal congestion, sore throat, sneezing, body aches and cough.  Patient was febrile with EMS, EMS gave patient 650 mg of Tylenol and she is now afebrile.  She denies shortness of breath, chest pain.  She denies any known sick contacts.  HPI     Home Medications Prior to Admission medications   Not on File      Allergies    Citrus and Cashew nut oil    Review of Systems   Review of Systems  Constitutional:  Positive for fever.  HENT:  Positive for congestion, ear pain, rhinorrhea, sinus pressure, sinus pain, sneezing and sore throat.   All other systems reviewed and are negative.   Physical Exam Updated Vital Signs BP 131/83 (BP Location: Right Arm)   Pulse 97   Temp 99.3 F (37.4 C) (Oral)   Resp 19   LMP 04/04/2023 (Exact Date)   SpO2 100%  Physical Exam Vitals and nursing note reviewed.  Constitutional:      General: She is not in acute distress.    Appearance: Normal appearance. She is not ill-appearing, toxic-appearing or diaphoretic.  HENT:     Head: Normocephalic and atraumatic.     Left Ear: A middle ear effusion is present. Tympanic membrane is not injected, perforated, erythematous or bulging.     Nose: Nose normal.     Mouth/Throat:     Mouth: Mucous membranes are moist.     Pharynx: Oropharynx is clear. Posterior oropharyngeal erythema present. No oropharyngeal exudate.  Eyes:     Extraocular Movements: Extraocular movements intact.     Conjunctiva/sclera: Conjunctivae  normal.     Pupils: Pupils are equal, round, and reactive to light.  Cardiovascular:     Rate and Rhythm: Normal rate and regular rhythm.  Pulmonary:     Effort: Pulmonary effort is normal.     Breath sounds: Normal breath sounds. No wheezing.  Abdominal:     General: Abdomen is flat. Bowel sounds are normal.     Palpations: Abdomen is soft.     Tenderness: There is no abdominal tenderness.  Musculoskeletal:     Cervical back: Normal range of motion and neck supple. No tenderness.  Skin:    General: Skin is warm and dry.     Capillary Refill: Capillary refill takes less than 2 seconds.  Neurological:     Mental Status: She is alert and oriented to person, place, and time.     ED Results / Procedures / Treatments   Labs (all labs ordered are listed, but only abnormal results are displayed) Labs Reviewed  RESP PANEL BY RT-PCR (RSV, FLU A&B, COVID)  [id number]    EKG None  Radiology No results found.  Procedures Procedures   Medications Ordered in ED Medications - No data to display  ED Course/ Medical Decision Making/ A&P  Medical Decision Making  32 year old female presents to the ED for evaluation.  Please see HPI for further details.  On examination the  patient is afebrile and nontachycardic.  Her lung sounds are clear bilaterally, she is not hypoxic.  Her abdomen is soft and compressible throughout.  Neurological examination is at baseline without focal deficits.  Left ear does have a middle ear effusion however does not appear to be infected, no erythema, no bulging TM.  Right ear unremarkable.  Posterior oropharynx is erythematous without exudate.  Uvula is midline, she is handling secretions appropriately, there is no drooling, there is no change phonation.  Viral panel negative for all.  Patient most likely suffering from URI.  Will advise her to continue conservative care at home.  Will have her follow-up with PCP in the interim.  Advised to return to the ED with  any new or worsening signs or symptoms and she voiced understanding.  She had all of her questions answered to her satisfaction.  She is stable to discharge home at this time.   Final Clinical Impression(s) / ED Diagnoses Final diagnoses:  Viral illness    Rx / DC Orders ED Discharge Orders     None         Clent Ridges 04/25/23 2057    Wynetta Fines, MD 04/25/23 2243

## 2023-11-25 ENCOUNTER — Other Ambulatory Visit (HOSPITAL_COMMUNITY)
Admission: RE | Admit: 2023-11-25 | Discharge: 2023-11-25 | Disposition: A | Discharge status: Home / Self Care | Source: Ambulatory Visit | Attending: Obstetrics and Gynecology | Admitting: Obstetrics and Gynecology

## 2023-11-25 ENCOUNTER — Ambulatory Visit: Admitting: Obstetrics and Gynecology

## 2023-11-25 ENCOUNTER — Encounter: Payer: Self-pay | Admitting: Obstetrics and Gynecology

## 2023-11-25 VITALS — BP 91/60 | HR 65 | Wt 142.7 lb

## 2023-11-25 DIAGNOSIS — Z1331 Encounter for screening for depression: Secondary | ICD-10-CM

## 2023-11-25 DIAGNOSIS — Z124 Encounter for screening for malignant neoplasm of cervix: Secondary | ICD-10-CM

## 2023-11-25 DIAGNOSIS — Z3009 Encounter for other general counseling and advice on contraception: Secondary | ICD-10-CM

## 2023-11-25 DIAGNOSIS — Z113 Encounter for screening for infections with a predominantly sexual mode of transmission: Secondary | ICD-10-CM | POA: Diagnosis not present

## 2023-11-25 DIAGNOSIS — Z30011 Encounter for initial prescription of contraceptive pills: Secondary | ICD-10-CM | POA: Diagnosis not present

## 2023-11-25 LAB — POCT URINE PREGNANCY: Preg Test, Ur: NEGATIVE

## 2023-11-25 MED ORDER — NORETHIN-ETH ESTRAD-FE BIPHAS 1 MG-10 MCG / 10 MCG PO TABS: 1.0000 | ORAL_TABLET | Freq: Every day | ORAL | 11 refills | Status: DC

## 2023-11-25 NOTE — Progress Notes (Signed)
   GYNECOLOGY PROGRESS NOTE  History:  33 y.o. G2P1103 presents to [facility], here for birth control consult. LMP around 6/11-6/14/25. Was having unprotected intercourse. Has been on depo previously.  Reports vaginal discharge and odor  The following portions of the patient's history were reviewed and updated as appropriate: allergies, current medications, past family history, past medical history, past social history, past surgical history and problem list. Last pap smear on 2019 was normal  Health Maintenance Due  Topic Date Due   Hepatitis C Screening  Never done   Hepatitis B Vaccines (1 of 3 - 19+ 3-dose series) Never done   HPV VACCINES (1 - 3-dose SCDM series) Never done   Cervical Cancer Screening (HPV/Pap Cotest)  05/25/2021   DTaP/Tdap/Td (2 - Td or Tdap) 08/08/2022   COVID-19 Vaccine (1 - 2024-25 season) Never done     Review of Systems:  Pertinent items are noted in HPI.   Objective:  Physical Exam Blood pressure 91/60, pulse 65, weight 142 lb 11.2 oz (64.7 kg), last menstrual period 10/20/2023, unknown if currently breastfeeding. VS reviewed, nursing note reviewed,  Constitutional: well developed, well nourished, no distress HEENT: normocephalic Pulm/chest wall: normal effort Breast Exam: deferred Abdomen: soft Neuro: alert and oriented  Skin: warm, dry Psych: affect normal Pelvic exam: Cervix pink, visually closed, without lesion, scant white creamy discharge, vaginal walls and external genitalia normal Bimanual exam: Cervix 0/long/high, firm, anterior, neg CMT, uterus nontender, nonenlarged, adnexa without tenderness, enlargement, or mass  Assessment & Plan:  1. Birth control counseling (Primary) 2. Encounter for initial prescription of contraceptive pills - Reviewed different types of birth control available: OCPs, depo, does not desire implant or IUD. We reviewed the advantages and risks and expected side effects of each. -UPT negative -discussed initiating  day one of menstrual cycle - no contraindications  - Norethindrone-Ethinyl Estradiol-Fe Biphas (LO LOESTRIN FE) 1 MG-10 MCG / 10 MCG tablet; Take 1 tablet by mouth daily.  Dispense: 28 tablet; Refill: 11  3. Screen for STD (sexually transmitted disease) Desires screen today  - POCT urine pregnancy - HIV antibody (with reflex) - RPR - Hepatitis B Surface AntiGEN - Hepatitis C Antibody - Cervicovaginal ancillary only( [place])  4. Cervical cancer screening  - Cytology - PAP( [place])  5. Positive depression screening Referral placed for counselor  - Ambulatory referral to Integrated Behavioral Health  Return in two months for virtual follow up with Kohan Azizi  Future Appointments  Date Time Provider Department Center  12/01/2023 10:15 AM Sherrine Marcelyn CROME, [place] [facility] None  01/26/2024 11:15 AM Delores Nidia CROME, FNP [facility] None    Nidia Delores, FNP

## 2023-11-25 NOTE — Progress Notes (Signed)
 New GYN Pt is wanting BC pills LMP estimated 10/20/23 Pt states that she recently had unprotected sex and is concerned about possible pregnancy. Reports vaginal discharge and odor, desires std testing today.

## 2023-11-26 ENCOUNTER — Ambulatory Visit: Payer: Self-pay | Admitting: Obstetrics and Gynecology

## 2023-11-26 LAB — CERVICOVAGINAL ANCILLARY ONLY
Bacterial Vaginitis (gardnerella): POSITIVE — AB
Candida Glabrata: NEGATIVE
Candida Vaginitis: NEGATIVE
Chlamydia: NEGATIVE
Comment: NEGATIVE
Comment: NEGATIVE
Comment: NEGATIVE
Comment: NEGATIVE
Comment: NEGATIVE
Comment: NORMAL
Neisseria Gonorrhea: NEGATIVE
Trichomonas: NEGATIVE

## 2023-11-26 LAB — HIV ANTIBODY (ROUTINE TESTING W REFLEX): HIV Screen 4th Generation wRfx: NONREACTIVE

## 2023-11-26 LAB — HEPATITIS C ANTIBODY: Hep C Virus Ab: NONREACTIVE

## 2023-11-26 LAB — RPR: RPR Ser Ql: NONREACTIVE

## 2023-11-26 LAB — HEPATITIS B SURFACE ANTIGEN: Hepatitis B Surface Ag: NEGATIVE

## 2023-11-29 ENCOUNTER — Other Ambulatory Visit: Payer: Self-pay

## 2023-11-29 MED ORDER — METRONIDAZOLE 500 MG PO TABS: 500.0000 mg | ORAL_TABLET | Freq: Two times a day (BID) | ORAL | 0 refills | Status: DC

## 2023-12-01 ENCOUNTER — Ambulatory Visit: Payer: Self-pay | Admitting: Licensed Clinical Social Worker

## 2023-12-01 DIAGNOSIS — F4322 Adjustment disorder with anxiety: Secondary | ICD-10-CM

## 2023-12-01 NOTE — BH Specialist Note (Signed)
 Integrated Behavioral Health via Telemedicine Visit  12/08/2023 Kaliah Haddaway [id number]  Number of Integrated Behavioral Health Clinician visits: 1- Initial Visit  Session Start time: 1015   Session End time: 1132  Total time in minutes: 77    Referring Provider: Nidia Daring Patient/Family location: At [facility] Provider location: Remote Office All persons participating in visit: Patient and [facility] Types of Service: Individual psychotherapy and Video visit  I connected with Darla Shoulder and/or Emaline Umana's patient via  Telephone or Engineer, civil (consulting)  (Video is Caregility application) and verified that I am speaking with the correct person using two identifiers. Discussed confidentiality: Yes   I discussed the limitations of telemedicine and the availability of in person appointments.  Discussed there is a possibility of technology failure and discussed alternative modes of communication if that failure occurs.  I discussed that engaging in this telemedicine visit, they consent to the provision of behavioral healthcare and the services will be billed under their insurance.  Patient and/or legal guardian expressed understanding and consented to Telemedicine visit: Yes   Presenting Concerns: Patient and/or family reports the following symptoms/concerns: increased anxiety  Duration of problem: Months; Severity of problem: moderate  Patient and/or Family's Strengths/Protective Factors: Social and Emotional competence, Concrete supports in place (healthy food, safe environments, etc.), Physical Health (exercise, healthy diet, medication compliance, etc.), and Caregiver has knowledge of parenting & child development  Goals Addressed: Patient will:  Reduce symptoms of: anxiety   Increase knowledge and/or ability of: coping skills, healthy habits, and self-management skills   Demonstrate ability to: Increase healthy adjustment to current life circumstances  and Increase adequate support systems for patient/family  Progress towards Goals: Ongoing    Interventions: Interventions utilized:  Mindfulness or Management consultant, Supportive Counseling, Psychoeducation and/or Health Education, and Supportive Reflection Standardized Assessments completed: Not Needed    Patient and/or Family Response:Patient was present for today's virtual session and shared that she is a mother of three children, including 68 year old twin daughters and a 8-year-old son. 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 She identified prayer and reading her Bible as grounding practices that support her emotional well-being. Since the divorce, the patient described being in "strong Mommy mode" and deeply rooted in caring for her children, but expressed a growing desire to reclaim time and space for herself. She acknowledged a history of anxiety and has been intentionally working through it, including a focus on developing healthy boundaries with her mother and learning how to assert herself without being disrespectful. The patient also explored challenges related to dating, noting difficulty with trust and lingering feelings of inadequacy stemming from trauma in her previous marriage. She expressed a desire to heal and build emotional openness in new relationships.   Clinical Assessment/Diagnosis  Adjustment disorder with anxious mood    Assessment: Patient currently experiencing symptoms of anxiety related to past trauma from a difficult divorce, challenges with setting boundaries, and struggles with trust and self-worth in new relationships. She is also navigating the balance between motherhood and her desire for personal growth and self-care..   Patient may benefit from continued support from integrated behavioral health  services..  Plan: Follow up with behavioral health clinician on : 12/08/23 Behavioral recommendations: patient to continue individual therapy to address anxiety symptoms, process past relationship trauma, and build self-esteem. Focus areas include developing healthy boundaries, improving trust  in relationships, and creating space for personal identity and self-care outside of her parenting role. Referral(s): Integrated [employer] (In Clinic)  I discussed the assessment and treatment plan with the patient and/or parent/guardian. They were provided an opportunity to ask questions and all were answered. They agreed with the plan and demonstrated an understanding of the instructions.   They were advised to call back or seek an in-person evaluation if the symptoms worsen or if the condition fails to improve as anticipated.  Ayliana Casciano LITTIE Seats, LCSWA

## 2023-12-02 LAB — CYTOLOGY - PAP
Chlamydia: NEGATIVE
Comment: NEGATIVE
Comment: NEGATIVE
Comment: NEGATIVE
Comment: NORMAL
Diagnosis: NEGATIVE
High risk HPV: NEGATIVE
Neisseria Gonorrhea: NEGATIVE
Trichomonas: NEGATIVE

## 2023-12-08 ENCOUNTER — Ambulatory Visit: Admitting: Licensed Clinical Social Worker

## 2023-12-08 DIAGNOSIS — F4322 Adjustment disorder with anxiety: Secondary | ICD-10-CM | POA: Diagnosis not present

## 2023-12-08 NOTE — BH Specialist Note (Signed)
 Integrated Behavioral Health via Telemedicine Visit  12/20/2023 Melody Williams [id number]  Number of Integrated Behavioral Health Clinician visits: 2- Second Visit  Session Start time: 1045   Session End time: 1106  Total time in minutes: 21    Referring Provider: Nidia Williams Patient/Family location: At [facility] Provider location: Remote Office All persons participating in visit: Patient and Melody Williams Types of Service: Individual psychotherapy and Video visit  I connected with Melody Williams and/or Melody Williams's patient via  Telephone or Engineer, civil (consulting)  (Video is Caregility application) and verified that I am speaking with the correct person using two identifiers. Discussed confidentiality: Yes   I discussed the limitations of telemedicine and the availability of in person appointments.  Discussed there is a possibility of technology failure and discussed alternative modes of communication if that failure occurs.  I discussed that engaging in this telemedicine visit, they consent to the provision of behavioral healthcare and the services will be billed under their insurance.  Patient and/or legal guardian expressed understanding and consented to Telemedicine visit: Yes   Presenting Concerns: Patient and/or family reports the following symptoms/concerns: Increase symptoms, possibly stemming from new birth control.  Duration of problem: Months; Severity of problem: moderate  Patient and/or Family's Strengths/Protective Factors: Concrete supports in place (healthy food, safe environments, etc.) and Physical Health (exercise, healthy diet, medication compliance, etc.)  Goals Addressed: Patient will:  Reduce symptoms of: anxiety   Increase knowledge and/or ability of: coping skills, healthy habits, and self-management skills   Demonstrate ability to: Increase healthy adjustment to current life circumstances and Increase adequate support systems for  patient/family  Progress towards Goals: Ongoing    Interventions: Interventions utilized:  Supportive Counseling, Communication Skills, Supportive Reflection, and Guided Imagery Standardized Assessments completed: Not Needed    Patient and/or Family Response: Patient was present for today's session and discussed concerns related to her current birth control pill, which she is taking to prevent pregnancy. She reports experiencing symptoms such as fatigue, spotting, headaches, breast tenderness, and diarrhea that began approximately three days ago, with the pill started on the 23rd. Patient expressed concern that she may be pregnant. She was encouraged to follow up with her PCP for evaluation and to consider discontinuing the medication if symptoms worsen. She also reported increased irritability and frustration since starting the medication, impacting her relationships and contributing to a desire for social withdrawal. In session, the patient engaged in a therapeutic discussion around setting appropriate boundaries, particularly with her mother. She demonstrated insight into the importance of boundary setting in maintaining emotional well-being.  Clinical Assessment/Diagnosis  Adjustment disorder with anxious mood    Assessment: Patient currently experiencing g physical symptoms possibly related to her birth control, along with increased irritability and emotional sensitivity that she attributes to the medication. She is also navigating interpersonal challenges, particularly around establishing boundaries with her mother..   Patient may benefit from continued support of integrated behavioral health.  Plan: Follow up with behavioral health clinician on : 12/21/2023 Behavioral recommendations: continued therapy to address emotional regulation and interpersonal boundary setting. Advised medical follow-up with PCP regarding physical symptoms and possible side effects of birth  control. Referral(s): Integrated [employer] (In Clinic)  I discussed the assessment and treatment plan with the patient and/or parent/guardian. They were provided an opportunity to ask questions and all were answered. They agreed with the plan and demonstrated an understanding of the instructions.   They were advised to call back or seek an in-person evaluation  if the symptoms worsen or if the condition fails to improve as anticipated.  Melody Williams, LCSWA

## 2023-12-20 ENCOUNTER — Encounter: Admitting: Licensed Clinical Social Worker

## 2023-12-21 ENCOUNTER — Ambulatory Visit: Admitting: Licensed Clinical Social Worker

## 2023-12-21 DIAGNOSIS — Z91199 Patient's noncompliance with other medical treatment and regimen due to unspecified reason: Secondary | ICD-10-CM

## 2023-12-21 NOTE — BH Specialist Note (Signed)
 Patient no sowed for today's visit.

## 2024-01-26 ENCOUNTER — Encounter: Payer: Self-pay | Admitting: Obstetrics and Gynecology

## 2024-01-26 ENCOUNTER — Telehealth (INDEPENDENT_AMBULATORY_CARE_PROVIDER_SITE_OTHER): Payer: Self-pay | Admitting: Obstetrics and Gynecology

## 2024-01-26 DIAGNOSIS — Z3009 Encounter for other general counseling and advice on contraception: Secondary | ICD-10-CM

## 2024-01-26 DIAGNOSIS — Z8742 Personal history of other diseases of the female genital tract: Secondary | ICD-10-CM | POA: Diagnosis not present

## 2024-01-26 NOTE — Progress Notes (Signed)
  I connected with Melody Williams: MyChart video and verified that I am speaking with the correct person using two identifiers.  Patient is located at home and provider is located at [facility] .     I discussed the limitations, risks, security and privacy concerns of performing an evaluation and management service by MyChart video and the availability of in person appointments. I also discussed with the patient that there may be a patient responsible charge related to this service. By engaging in this virtual visit, you consent to the provision of healthcare.  Additionally, you authorize for your insurance to be billed for the services provided during this visit.  The patient expressed understanding and agreed to proceed.          GYNECOLOGY PROGRESS NOTE  History:  33 y.o. G2P1103 presents to [facility] for birth control follow up. She stopped the birth control due to side effects. She reports day 1-5 stomach pain and nausea and headache and she started diarrhea. She is not currently sexually active. She had previously tried depo and reports having changes in mood.   She has a history of bv and yeast and has recently taken   The following portions of the patient's history were reviewed and updated as appropriate: allergies, current medications, past family history, past medical history, past social history, past surgical history and problem list. Last pap smear on 11/25/23 was normal, neg HRHPV.  Health Maintenance Due  Topic Date Due   Pneumococcal Vaccine (1 of 2 - PCV) Never done   Hepatitis B Vaccines 19-59 Average Risk (1 of 3 - 19+ 3-dose series) Never done   HPV VACCINES (1 - 3-dose SCDM series) Never done   DTaP/Tdap/Td (2 - Td or Tdap) 08/08/2022   Influenza Vaccine  12/10/2023   COVID-19 Vaccine (1 - 2024-25 season) Never done     Review of Systems:  Pertinent items are noted in HPI.   Objective:  Physical Exam Last menstrual period 12/30/2023, not currently  breastfeeding. VS reviewed, nursing note reviewed,  Constitutional: well developed, well nourished, no distress HEENT: normocephalic Pulm/chest wall: normal effort Neuro: alert and oriented  Psych: affect normal Pelvic exam: deferred  Assessment & Plan:  1. Birth control counseling (Primary) Discussed different options for birth control to include phexxi, POP, implant as well as hormonal and nonhormonal IUD. She is considering IUD. She is not currently sexually active  Discussed to call and schedule if she desires   2. Hx of vaginitis Discussed supportive measures for recurrent yeast or BV, encouraged to come in for repeat swab and mycoplasma genitalium if continues     Melody Daring, FNP

## 2024-01-26 NOTE — Progress Notes (Signed)
 Pt stopped taking birth control pills a week after due to nausea, diarrhea, and headache.   No other concerns at this time.

## 2024-04-20 ENCOUNTER — Ambulatory Visit (HOSPITAL_COMMUNITY)
Admission: EM | Admit: 2024-04-20 | Discharge: 2024-04-20 | Disposition: A | Discharge status: Home / Self Care | Attending: Emergency Medicine | Admitting: Emergency Medicine

## 2024-04-20 ENCOUNTER — Encounter (HOSPITAL_COMMUNITY): Payer: Self-pay

## 2024-04-20 DIAGNOSIS — E162 Hypoglycemia, unspecified: Secondary | ICD-10-CM | POA: Diagnosis not present

## 2024-04-20 DIAGNOSIS — R531 Weakness: Secondary | ICD-10-CM | POA: Diagnosis not present

## 2024-04-20 DIAGNOSIS — R519 Headache, unspecified: Secondary | ICD-10-CM | POA: Diagnosis not present

## 2024-04-20 DIAGNOSIS — R110 Nausea: Secondary | ICD-10-CM | POA: Diagnosis not present

## 2024-04-20 LAB — GLUCOSE, POCT (MANUAL RESULT ENTRY)
POC Glucose: 51 mg/dL — AB (ref 70–99)
POC Glucose: 82 mg/dL (ref 70–99)

## 2024-04-20 LAB — POCT URINE DIPSTICK
Bilirubin, UA: NEGATIVE
Blood, UA: NEGATIVE
Glucose, UA: NEGATIVE mg/dL
Ketones, POC UA: NEGATIVE mg/dL
Leukocytes, UA: NEGATIVE
Nitrite, UA: NEGATIVE
POC PROTEIN,UA: NEGATIVE
Spec Grav, UA: 1.025 (ref 1.010–1.025)
Urobilinogen, UA: 0.2 U/dL
pH, UA: 7 (ref 5.0–8.0)

## 2024-04-20 LAB — POCT URINE PREGNANCY: Preg Test, Ur: NEGATIVE

## 2024-04-20 MED ORDER — METOCLOPRAMIDE HCL 5 MG/ML IJ SOLN
5.0000 mg | Freq: Once | INTRAMUSCULAR | Status: AC
  Administered 2024-04-20: 5 mg via INTRAMUSCULAR

## 2024-04-20 MED ORDER — KETOROLAC TROMETHAMINE 30 MG/ML IJ SOLN
INTRAMUSCULAR | Status: AC
  Filled 2024-04-20: qty 1

## 2024-04-20 MED ORDER — DEXAMETHASONE SOD PHOSPHATE PF 10 MG/ML IJ SOLN
10.0000 mg | Freq: Once | INTRAMUSCULAR | Status: AC
  Administered 2024-04-20: 10 mg via INTRAMUSCULAR

## 2024-04-20 MED ORDER — KETOROLAC TROMETHAMINE 30 MG/ML IJ SOLN
30.0000 mg | Freq: Once | INTRAMUSCULAR | Status: AC
  Administered 2024-04-20: 30 mg via INTRAMUSCULAR

## 2024-04-20 MED ORDER — METOCLOPRAMIDE HCL 5 MG/ML IJ SOLN
INTRAMUSCULAR | Status: AC
  Filled 2024-04-20: qty 2

## 2024-04-20 NOTE — ED Notes (Signed)
 Gave patient Gatorade and peanut butter crackers.

## 2024-04-20 NOTE — ED Triage Notes (Signed)
 Patient here today with c/o headache, nausea, and weakness since 5-6 this morning.

## 2024-04-20 NOTE — ED Provider Notes (Signed)
 [facility]    CSN: [id number] Arrival date & time: 04/20/24  9176      History   Chief Complaint Chief Complaint  Patient presents with   Headache    HPI Melody Williams is a 33 y.o. female.   Patient reports that upon waking today around 5 to 6 AM she had a bad headache with mild nausea.  Patient reports that upon standing and getting out of the bed she felt very weak and fatigued.  Patient rates her headache a 7 out of 10 at this time.    Patient denies any vomiting, blurred vision, photophobia, unilateral numbness/weakness, confusion, slurred speech, and facial droop.  Patient also denies any recent head injuries.  Patient denies any recent excessive use of alcohol.    Patient reports that she is eating a large amount of food before going to bed last night.  Patient denies any symptoms prior to waking this morning.  LMP 11/21.  Patient reports that she has experienced something similar to this in the past but this resolved on its own.  Patient denies any history of migraines.  The history is provided by the patient and medical records.  Headache   Past Medical History:  Diagnosis Date   Acute meniscal tear of knee LEFT   Anxiety    Eczema    Eczema     Patient Active Problem List   Diagnosis Date Noted   Syncope and collapse 12/30/2021   Palpitations 12/30/2021   Light headedness 12/30/2021   Mild atrial enlargement, left 12/30/2021   History of prior pregnancy with IUGR newborn 01/17/2018   Depression 01/01/2014    Past Surgical History:  Procedure Laterality Date   CESAREAN SECTION N/A 08/05/2012   Procedure: CESAREAN SECTION;  Surgeon: Lynwood KANDICE Solomons, MD;  Location: [facility];  Service: Obstetrics;  Laterality: N/A;  Twins   FOOT SURGERY Right 2023   KNEE ARTHROSCOPY  05/01/2011   Procedure: ARTHROSCOPY KNEE;  Surgeon: Reyes JAYSON Billing;  Location: [facility];  Service: Orthopedics;  Laterality: Left;  left knee arthroscopy with  debridement ,removal of loose  bodies    OB History     Gravida  2   Para  2   Term  1   Preterm  1   AB  0   Living  3      SAB  0   IAB  0   Ectopic  0   Multiple  1   Live Births  3            Home Medications    Prior to Admission medications  Not on File    Family History Family History  Problem Relation Age of Onset   Healthy Mother    Healthy Father     Social History Social History[1]   Allergies   Peanut-containing drug products, Citrus, and Cashew nut oil   Review of Systems Review of Systems  Neurological:  Positive for headaches.   Per HPI  Physical Exam Triage Vital Signs ED Triage Vitals [04/20/24 0843]  Encounter Vitals Group     BP 113/63     Girls Systolic BP Percentile      Girls Diastolic BP Percentile      Boys Systolic BP Percentile      Boys Diastolic BP Percentile      Pulse Rate 68     Resp 17     Temp 98 F (36.7 C)  Temp Source Oral     SpO2 98 %     Weight      Height      Head Circumference      Peak Flow      Pain Score 10     Pain Loc      Pain Education      Exclude from Growth Chart    No data found.  Updated Vital Signs BP 113/63 (BP Location: Left Arm)   Pulse 68   Temp 98 F (36.7 C) (Oral)   Resp 17   LMP 03/31/2024 (Approximate)   SpO2 98%   Visual Acuity Right Eye Distance:   Left Eye Distance:   Bilateral Distance:    Right Eye Near:   Left Eye Near:    Bilateral Near:     Physical Exam Vitals and nursing note reviewed.  Constitutional:      General: She is awake. She is not in acute distress.    Appearance: Normal appearance. She is well-developed and well-groomed. She is not ill-appearing.  HENT:     Head: Normocephalic.  Eyes:     Extraocular Movements: Extraocular movements intact.     Conjunctiva/sclera: Conjunctivae normal.     Pupils: Pupils are equal, round, and reactive to light.  Cardiovascular:     Rate and Rhythm: Normal rate and regular rhythm.   Pulmonary:     Effort: Pulmonary effort is normal.     Breath sounds: Normal breath sounds.  Musculoskeletal:        General: Normal range of motion.     Cervical back: Normal range of motion and neck supple.  Skin:    General: Skin is warm and dry.  Neurological:     General: No focal deficit present.     Mental Status: She is alert and oriented to person, place, and time. Mental status is at baseline.     GCS: GCS eye subscore is 4. GCS verbal subscore is 5. GCS motor subscore is 6.     Cranial Nerves: Cranial nerves 2-12 are intact.     Sensory: Sensation is intact.     Motor: Motor function is intact.     Coordination: Coordination is intact.     Gait: Gait is intact.  Psychiatric:        Behavior: Behavior is cooperative.      UC Treatments / Results  Labs (all labs ordered are listed, but only abnormal results are displayed) Labs Reviewed  POCT URINE DIPSTICK - Abnormal; Notable for the following components:      Result Value   Clarity, UA cloudy (*)    All other components within normal limits  GLUCOSE, POCT (MANUAL RESULT ENTRY) - Abnormal; Notable for the following components:   POC Glucose 51 (*)    All other components within normal limits  POCT URINE PREGNANCY  GLUCOSE, POCT (MANUAL RESULT ENTRY)    EKG   Radiology No results found.  Procedures Procedures (including critical care time)  Medications Ordered in UC Medications  ketorolac  (TORADOL ) 30 MG/ML injection 30 mg (30 mg Intramuscular Given 04/20/24 0938)  metoCLOPramide  (REGLAN ) injection 5 mg (5 mg Intramuscular Given 04/20/24 0938)  dexamethasone  (DECADRON ) injection 10 mg (10 mg Intramuscular Given 04/20/24 9061)    Initial Impression / Assessment and Plan / UC Course  I have reviewed the triage vital signs and the nursing notes.  Pertinent labs & imaging results that were available during my care of the patient were reviewed by  me and considered in my medical decision making (see chart  for details).     Patient is overall well-appearing.  Vitals are stable.  No significant findings on exam no neurodeficits noted.  GCS 15.  CBG was found to be low at 51.  Urinalysis unremarkable, UPT negative.  Given Gatorade and crackers in clinic with improvement of CBG.  Given IM Toradol , Reglan , and Decadron  in clinic for bad headache and nausea.  Discussed importance of hydration and snacking to avoid the symptoms in the future as well as continued hypoglycemia. Discussed follow-up, return, and strict ER precautions. Final Clinical Impressions(s) / UC Diagnoses   Final diagnoses:  Nausea  Bad headache  Weakness  Hypoglycemia     Discharge Instructions      You were given a series of medications today to help with your headache: Toradol  which is an anti-inflammatory, Decadron  which is a steroid to help with inflammation, and Reglan  which is a strong nausea medication that works in combination with the 2 other medications to help relieve your headache.  If your headache persists, worsens, you develop worsening vision changes, severe dizziness, passing out, weakness, numbness, chest pain, confusion, and slurred speech please seek immediate medical treatment in the emergency department.  Your blood sugar was also slightly low in clinic today.  Make sure you are eating a small snacks throughout the day to help with this in the future.  Also make sure you are staying well-hydrated as this could be contributing to your symptoms as well. Follow-up with your primary care provider or return here as needed.    ED Prescriptions   None    PDMP not reviewed this encounter.    [1]  Social History Tobacco Use   Smoking status: Never   Smokeless tobacco: Never  Vaping Use   Vaping status: Never Used  Substance Use Topics   Alcohol use: No   Drug use: Not Currently    Types: Marijuana     Johnie Rumaldo LABOR, NP 04/20/24 [phone]

## 2024-04-20 NOTE — Discharge Instructions (Signed)
 You were given a series of medications today to help with your headache: Toradol  which is an anti-inflammatory, Decadron  which is a steroid to help with inflammation, and Reglan  which is a strong nausea medication that works in combination with the 2 other medications to help relieve your headache.  If your headache persists, worsens, you develop worsening vision changes, severe dizziness, passing out, weakness, numbness, chest pain, confusion, and slurred speech please seek immediate medical treatment in the emergency department.  Your blood sugar was also slightly low in clinic today.  Make sure you are eating a small snacks throughout the day to help with this in the future.  Also make sure you are staying well-hydrated as this could be contributing to your symptoms as well. Follow-up with your primary care provider or return here as needed.
